# Patient Record
Sex: Male | Born: 1995
Health system: Southern US, Community
[De-identification: ages and names within clinical notes are randomized; demographics above are authoritative.]

## PROBLEM LIST (undated history)

## (undated) DIAGNOSIS — K219 Gastro-esophageal reflux disease without esophagitis: Secondary | ICD-10-CM

## (undated) DIAGNOSIS — S62109A Fracture of unspecified carpal bone, unspecified wrist, initial encounter for closed fracture: Secondary | ICD-10-CM

## (undated) DIAGNOSIS — T7840XA Allergy, unspecified, initial encounter: Secondary | ICD-10-CM

## (undated) DIAGNOSIS — K200 Eosinophilic esophagitis: Secondary | ICD-10-CM

## (undated) DIAGNOSIS — F909 Attention-deficit hyperactivity disorder, unspecified type: Secondary | ICD-10-CM

## (undated) DIAGNOSIS — F419 Anxiety disorder, unspecified: Secondary | ICD-10-CM

## (undated) HISTORY — PX: TYMPANOSTOMY TUBE PLACEMENT: SHX32

## (undated) HISTORY — DX: Fracture of unspecified carpal bone, unspecified wrist, initial encounter for closed fracture: S62.109A

## (undated) HISTORY — PX: WISDOM TOOTH EXTRACTION: SHX21

## (undated) HISTORY — PX: ESOPHAGOGASTRODUODENOSCOPY: SHX1529

## (undated) HISTORY — DX: Allergy, unspecified, initial encounter: T78.40XA

## (undated) HISTORY — DX: Eosinophilic esophagitis: K20.0

## (undated) HISTORY — DX: Attention-deficit hyperactivity disorder, unspecified type: F90.9

## (undated) HISTORY — DX: Gastro-esophageal reflux disease without esophagitis: K21.9

---
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
## 2016-05-11 ENCOUNTER — Encounter: Payer: Self-pay | Admitting: Family Medicine

## 2016-05-11 ENCOUNTER — Ambulatory Visit (INDEPENDENT_AMBULATORY_CARE_PROVIDER_SITE_OTHER): Payer: 59 | Admitting: Family Medicine

## 2016-05-11 VITALS — BP 134/82 | HR 89 | Temp 98.5°F | Ht 69.09 in | Wt 182.6 lb

## 2016-05-11 DIAGNOSIS — G4700 Insomnia, unspecified: Secondary | ICD-10-CM

## 2016-05-11 MED ORDER — CLONAZEPAM 0.5 MG PO TABS: 0.5000 mg | ORAL_TABLET | Freq: Two times a day (BID) | ORAL | 1 refills | Status: DC | PRN

## 2016-05-11 NOTE — Progress Notes (Signed)
Pre visit review using our clinic review tool, if applicable. No additional management support is needed unless otherwise documented below in the visit note. 

## 2016-05-11 NOTE — Progress Notes (Signed)
Mitchell [facility]man is a 21 y.o. male [facility]   History of Present Illness:   Chief Complaint  Patient presents with  . Establish Care  . Insomnia    x 1.5 years   Insomnia  Primary symptoms: difficulty falling asleep.  The problem occurs nightly. The problem is unchanged. The symptoms are aggravated by anxiety. Past treatments include nothing. Typical bedtime:  11-12 P.M..  How long after going to bed to you fall asleep: over an hour.   PMH includes: no depression, no restless leg syndrome, no chronic pain, no apnea. Prior diagnostic workup includes:  No prior workup.   Health Maintenance Due  Topic Date Due  . HIV Screening  06/08/2011  . TETANUS/TDAP  06/08/2015  . INFLUENZA VACCINE  09/23/2015   PMHx, SurgHx, SocialHx, Medications, and Allergies were reviewed in the Visit Navigator and updated as appropriate.   Past Medical History:  Diagnosis Date  . ADHD (attention deficit hyperactivity disorder)   . Allergy   . Asthma   . Wrist fracture    No past surgical history on file.  No family history on file.  Social History  Substance Use Topics  . Smoking status: Never Smoker  . Smokeless tobacco: Never Used  . Alcohol use No   Current Medications and Allergies:   None  No Known Allergies   Review of Systems:   Review of Systems  Constitutional: Negative for chills, fever and weight loss.  HENT: Negative for congestion, ear pain, hearing loss, nosebleeds and sore throat.   Eyes: Negative for blurred vision and pain.  Respiratory: Negative for apnea, cough, shortness of breath and wheezing.   Cardiovascular: Negative for chest pain.  Gastrointestinal: Negative for constipation, heartburn, nausea and vomiting.  Musculoskeletal: Negative for back pain, joint pain and neck pain.  Skin: Negative for itching and rash.  Neurological: Negative for dizziness, seizures, weakness and headaches.  Psychiatric/Behavioral: Negative for depression and  substance abuse. The patient has insomnia.     Vitals:   Vitals:   05/11/16 1534  BP: 134/82  Pulse: 89  Temp: 98.5 F (36.9 C)  TempSrc: Oral  SpO2: 97%  Weight: 182 lb 9.6 oz (82.8 kg)  Height: 5' 9.09" (1.755 m)     Body mass index is 26.89 kg/m.   Physical Exam:   Physical Exam  Constitutional: He is oriented to person, place, and time. He appears well-developed and well-nourished. No distress.  HENT:  Head: Normocephalic and atraumatic.  Right Ear: External ear normal.  Left Ear: External ear normal.  Nose: Nose normal.  Mouth/Throat: Oropharynx is clear and moist.  Eyes: Conjunctivae and EOM are normal. Pupils are equal, round, and reactive to light.  Neck: Normal range of motion. Neck supple.  Cardiovascular: Normal rate, regular rhythm, normal heart sounds and intact distal pulses.   Pulmonary/Chest: Effort normal and breath sounds normal.  Abdominal: Soft. Bowel sounds are normal.  Musculoskeletal: Normal range of motion.  Neurological: He is alert and oriented to person, place, and time.  Skin: Skin is warm and dry.  Psychiatric: He has a normal mood and affect. His behavior is normal. Judgment and thought content normal.  Nursing note and vitals reviewed.    Assessment and Plan:    [name] was seen today for establish care and insomnia.  Diagnoses and all orders for this visit:  Insomnia, unspecified type Comments: No red flags or concern for high risk behaviors. Patient would like to hold off on  a chronic medication for now. Okay short-term medication below with instructions for sleep hygiene. If not improving, patient to follow up for discussion re: chronic medication. Orders: -     clonazePAM (KLONOPIN) 0.5 MG tablet; Take 1 tablet (0.5 mg total) by mouth 2 (two) times daily as needed for anxiety.   . Reviewed expectations re: course of current medical issues. . Discussed self-management of symptoms. . Outlined signs and symptoms indicating need  for more acute intervention. . Patient verbalized understanding and all questions were answered. . See orders for this visit as documented in the electronic medical record. . Patient received an After Visit Summary.  Records requested if needed. I spent 20 minutes with this patient, greater than 50% was face-to-face time counseling regarding the above diagnoses.  CMA served as Neurosurgeon during this visit. History, Physical, and Plan performed by medical provider. Documentation and orders reviewed and attested to. Helane Rima, D.O.  Helane Rima, D.O. [place], [facility] 05/12/2016   Follow-up: No Follow-up on file.  Meds ordered this encounter  Medications  . clonazePAM (KLONOPIN) 0.5 MG tablet    Sig: Take 1 tablet (0.5 mg total) by mouth 2 (two) times daily as needed for anxiety.    Dispense:  20 tablet    Refill:  1   Medications Discontinued During This Encounter  Medication Reason  . fluticasone (FLONASE) 50 MCG/ACT nasal spray Patient Preference  . loratadine (CLARITIN) 10 MG tablet Patient Preference  . methylphenidate (CONCERTA) 54 MG CR tablet Patient Preference   No orders of the defined types were placed in this encounter.

## 2016-05-12 ENCOUNTER — Encounter: Payer: Self-pay | Admitting: Family Medicine

## 2016-06-04 ENCOUNTER — Other Ambulatory Visit: Payer: Self-pay | Admitting: Family Medicine

## 2016-06-04 DIAGNOSIS — J301 Allergic rhinitis due to pollen: Secondary | ICD-10-CM

## 2016-06-04 DIAGNOSIS — J302 Other seasonal allergic rhinitis: Secondary | ICD-10-CM | POA: Insufficient documentation

## 2016-06-04 MED ORDER — AZELASTINE-FLUTICASONE 137-50 MCG/ACT NA SUSP: NASAL | 3 refills | Status: DC

## 2016-06-04 MED ORDER — MONTELUKAST SODIUM 10 MG PO TABS: 10.0000 mg | ORAL_TABLET | Freq: Every day | ORAL | 3 refills | Status: DC

## 2016-07-07 ENCOUNTER — Other Ambulatory Visit: Payer: Self-pay | Admitting: Physician Assistant

## 2016-07-07 MED ORDER — TRIAMCINOLONE ACETONIDE 0.1 % EX CREA: 1.0000 "application " | TOPICAL_CREAM | Freq: Two times a day (BID) | CUTANEOUS | 0 refills | Status: DC

## 2016-09-10 ENCOUNTER — Other Ambulatory Visit: Payer: Self-pay | Admitting: Family Medicine

## 2016-09-10 MED ORDER — ALBUTEROL SULFATE HFA 108 (90 BASE) MCG/ACT IN AERS: 2.0000 | INHALATION_SPRAY | Freq: Four times a day (QID) | RESPIRATORY_TRACT | 2 refills | Status: DC | PRN

## 2016-09-13 ENCOUNTER — Encounter: Payer: Self-pay | Admitting: Family Medicine

## 2016-09-13 ENCOUNTER — Ambulatory Visit (INDEPENDENT_AMBULATORY_CARE_PROVIDER_SITE_OTHER): Payer: 59

## 2016-09-13 ENCOUNTER — Ambulatory Visit (INDEPENDENT_AMBULATORY_CARE_PROVIDER_SITE_OTHER): Payer: 59 | Admitting: Family Medicine

## 2016-09-13 VITALS — BP 128/94 | HR 102 | Temp 98.2°F

## 2016-09-13 DIAGNOSIS — R0789 Other chest pain: Secondary | ICD-10-CM | POA: Diagnosis not present

## 2016-09-13 DIAGNOSIS — G4700 Insomnia, unspecified: Secondary | ICD-10-CM | POA: Diagnosis not present

## 2016-09-13 DIAGNOSIS — R079 Chest pain, unspecified: Secondary | ICD-10-CM | POA: Diagnosis not present

## 2016-09-13 DIAGNOSIS — R0602 Shortness of breath: Secondary | ICD-10-CM | POA: Diagnosis not present

## 2016-09-13 MED ORDER — METHYLPREDNISOLONE ACETATE 80 MG/ML IJ SUSP
80.0000 mg | Freq: Once | INTRAMUSCULAR | Status: AC
  Administered 2016-09-13: 80 mg via INTRAMUSCULAR

## 2016-09-13 MED ORDER — CLONAZEPAM 0.5 MG PO TABS: 0.5000 mg | ORAL_TABLET | Freq: Two times a day (BID) | ORAL | 1 refills | Status: DC | PRN

## 2016-09-13 MED FILL — clonazePAM 0.5 MG TABS: 0.5 | 10 days supply | Qty: 20 | Fill #0

## 2016-09-13 NOTE — Progress Notes (Signed)
Mitchell Bowman is a 21 y.o. male here for an acute visit.  History of Present Illness:   Britt Bottom CMA acting as scribe for Dr. Earlene Plater.  HPI: Patient comes in today for chest pain that started about Tuesday of last week. Chest pain is generally on the left side that radiates to the right side. He states that he is having a hard time getting a deep breath.   PMHx, SurgHx, SocialHx, Medications, and Allergies were reviewed in the Visit Navigator and updated as appropriate.  Current Medications:   .  albuterol (PROVENTIL HFA;VENTOLIN HFA) 108 (90 Base) MCG/ACT inhaler, Inhale 2 puffs into the lungs every 6 (six) hours as needed for wheezing or shortness of breath., Disp: 1 Inhaler, Rfl: 2 .  Azelastine-Fluticasone (DYMISTA) 137-50 MCG/ACT SUSP, 2 sprays q nostril daily, Disp: 23 g, Rfl: 3 .  clonazePAM (KLONOPIN) 0.5 MG tablet, Take 1 tablet (0.5 mg total) by mouth 2 (two) times daily as needed for anxiety., Disp: 20 tablet, Rfl: 1 .  montelukast (SINGULAIR) 10 MG tablet, Take 1 tablet (10 mg total) by mouth at bedtime., Disp: 30 tablet, Rfl: 3 .  triamcinolone cream (KENALOG) 0.1 %, Apply 1 application topically 2 (two) times daily., Disp: 30 g, Rfl: 0   No Known Allergies   Review of Systems:   Pertinent items are noted in the HPI. Otherwise, ROS is negative.  Vitals:   Vitals:   09/13/16 1017 09/13/16 1029  BP: (!) 138/98 (!) 128/94  Pulse: (!) 102   Temp: 98.2 F (36.8 C)   TempSrc: Oral   SpO2: 97%      There is no height or weight on file to calculate BMI.  Physical Exam:   Physical Exam  Constitutional: He is oriented to person, place, and time. He appears well-developed and well-nourished. No distress.  HENT:  Head: Normocephalic and atraumatic.  Right Ear: External ear normal.  Left Ear: External ear normal.  Nose: Nose normal.  Mouth/Throat: Oropharynx is clear and moist.  Eyes: Pupils are equal, round, and reactive to light. Conjunctivae and EOM are  normal.  Neck: Normal range of motion. Neck supple.  Cardiovascular: Normal rate, regular rhythm, normal heart sounds and intact distal pulses.   Pulmonary/Chest: Effort normal and breath sounds normal.  Abdominal: Soft. Bowel sounds are normal.  Musculoskeletal: Normal range of motion.  Neurological: He is alert and oriented to person, place, and time.  Skin: Skin is warm and dry.  Psychiatric: He has a normal mood and affect. His behavior is normal. Judgment and thought content normal.  Nursing note and vitals reviewed.   EKG: normal EKG, normal sinus rhythm.   EXAM: CHEST  2 VIEW  COMPARISON:  No prior.  FINDINGS: Mediastinum hilar structures normal. Lungs are clear. No pleural effusion or pneumothorax. Heart size normal. Thoracic spine scoliosis .  IMPRESSION: No acute cardiopulmonary disease.  Assessment and Plan:   Diagnoses and all orders for this visit:  Atypical chest pain Comments: No red flags. HR normalized. No concern for PE. Likely pleuritic pain. Treatment as below. Symptomatic care and red flags reviewed. Orders: -     DG Chest 2 View -     EKG 12-Lead  Shortness of breath -     methylPREDNISolone acetate (DEPO-MEDROL) injection 80 mg; Inject 1 mL (80 mg total) into the muscle once.  Insomnia, unspecified type Comments: No red flags.  Orders: -     clonazePAM (KLONOPIN) 0.5 MG tablet; Take 1 tablet (0.5  mg total) by mouth 2 (two) times daily as needed for anxiety.    . Reviewed expectations re: course of current medical issues. . Discussed self-management of symptoms. . Outlined signs and symptoms indicating need for more acute intervention. . Patient verbalized understanding and all questions were answered. Marland Kitchen. Health Maintenance issues including appropriate healthy diet, exercise, and smoking avoidance were discussed with patient. . See orders for this visit as documented in the electronic medical record. . Patient received an After Visit  Summary.  CMA served as Neurosurgeonscribe during this visit. History, Physical, and Plan performed by medical provider. The above documentation has been reviewed and is accurate and complete. Helane RimaErica Sway Guttierrez, D.O.  Helane RimaErica Alanah Sakuma, DO [place] 09/17/2016

## 2016-10-13 ENCOUNTER — Other Ambulatory Visit: Payer: Self-pay | Admitting: Surgical

## 2016-10-13 ENCOUNTER — Ambulatory Visit (INDEPENDENT_AMBULATORY_CARE_PROVIDER_SITE_OTHER): Payer: 59 | Admitting: Family Medicine

## 2016-10-13 DIAGNOSIS — M7671 Peroneal tendinitis, right leg: Secondary | ICD-10-CM

## 2016-10-13 DIAGNOSIS — Z13220 Encounter for screening for lipoid disorders: Secondary | ICD-10-CM | POA: Diagnosis not present

## 2016-10-13 DIAGNOSIS — R5383 Other fatigue: Secondary | ICD-10-CM

## 2016-10-13 LAB — COMPREHENSIVE METABOLIC PANEL
ALT: 16 U/L (ref 0–53)
AST: 15 U/L (ref 0–37)
Albumin: 5.1 g/dL (ref 3.5–5.2)
Alkaline Phosphatase: 58 U/L (ref 39–117)
BUN: 14 mg/dL (ref 6–23)
CO2: 29 mEq/L (ref 19–32)
Calcium: 10 mg/dL (ref 8.4–10.5)
Chloride: 104 mEq/L (ref 96–112)
Creatinine, Ser: 0.8 mg/dL (ref 0.40–1.50)
GFR: 130.53 mL/min (ref >60.00)
Glucose, Bld: 92 mg/dL (ref 70–99)
Potassium: 3.8 mEq/L (ref 3.5–5.1)
Sodium: 140 mEq/L (ref 135–145)
Total Bilirubin: 0.6 mg/dL (ref 0.2–1.2)
Total Protein: 7.9 g/dL (ref 6.0–8.3)

## 2016-10-13 LAB — LIPID PANEL
Cholesterol: 172 mg/dL (ref 0–200)
HDL: 45.3 mg/dL (ref >39.00)
LDL Cholesterol: 96 mg/dL (ref 0–99)
NonHDL: 127.03
Total CHOL/HDL Ratio: 4
Triglycerides: 157 mg/dL — ABNORMAL HIGH (ref 0.0–149.0)
VLDL: 31.4 mg/dL (ref 0.0–40.0)

## 2016-10-13 LAB — CBC WITH DIFFERENTIAL/PLATELET
Basophils Absolute: 0.1 10*3/uL (ref 0.0–0.1)
Basophils Relative: 0.7 % (ref 0.0–3.0)
Eosinophils Absolute: 0.3 10*3/uL (ref 0.0–0.7)
Eosinophils Relative: 3.6 % (ref 0.0–5.0)
HCT: 46 % (ref 39.0–52.0)
Hemoglobin: 15.6 g/dL (ref 13.0–17.0)
Lymphocytes Relative: 38.3 % (ref 12.0–46.0)
Lymphs Abs: 2.7 10*3/uL (ref 0.7–4.0)
MCHC: 33.9 g/dL (ref 30.0–36.0)
MCV: 86.1 fl (ref 78.0–100.0)
Monocytes Absolute: 0.4 10*3/uL (ref 0.1–1.0)
Monocytes Relative: 5.7 % (ref 3.0–12.0)
Neutro Abs: 3.6 10*3/uL (ref 1.4–7.7)
Neutrophils Relative %: 51.7 % (ref 43.0–77.0)
Platelets: 209 10*3/uL (ref 150.0–400.0)
RBC: 5.35 Mil/uL (ref 4.22–5.81)
RDW: 12.2 % (ref 11.5–14.6)
WBC: 7 10*3/uL (ref 4.5–10.5)

## 2016-10-14 ENCOUNTER — Encounter: Payer: Self-pay | Admitting: Family Medicine

## 2016-10-14 DIAGNOSIS — M7671 Peroneal tendinitis, right leg: Secondary | ICD-10-CM | POA: Insufficient documentation

## 2016-10-14 NOTE — Progress Notes (Signed)
Mitchell Bowman is a 21 y.o. male here for an acute visit.  History of Present Illness:   Ankle Pain   The incident occurred more than 1 week ago. The incident occurred at school. There was no injury mechanism. The pain is present in the right ankle. The quality of the pain is described as aching. The pain is mild. The pain has been intermittent since onset. The symptoms are aggravated by weight bearing. He has tried nothing for the symptoms.   PMHx, SurgHx, SocialHx, Medications, and Allergies were reviewed in the Visit Navigator and updated as appropriate.  Current Medications:   Current Outpatient Prescriptions:  .  albuterol (PROVENTIL HFA;VENTOLIN HFA) 108 (90 Base) MCG/ACT inhaler, Inhale 2 puffs into the lungs every 6 (six) hours as needed for wheezing or shortness of breath., Disp: 1 Inhaler, Rfl: 2 .  clonazePAM (KLONOPIN) 0.5 MG tablet, Take 1 tablet (0.5 mg total) by mouth 2 (two) times daily as needed for anxiety., Disp: 20 tablet, Rfl: 1 .  montelukast (SINGULAIR) 10 MG tablet, Take 1 tablet (10 mg total) by mouth at bedtime., Disp: 30 tablet, Rfl: 3   No Known Allergies   Review of Systems:   Pertinent items are noted in the HPI. Otherwise, ROS is negative.  Vitals:  There were no vitals filed for this visit.   There is no height or weight on file to calculate BMI. Physical Exam:   Physical Exam  Musculoskeletal:       Right ankle: He exhibits normal range of motion, no deformity and normal pulse. Tenderness. Achilles tendon normal.       Feet:    Results for orders placed or performed in visit on 10/13/16  CBC with Differential/Platelet  Result Value Ref Range   WBC 7.0 4.5 - 10.5 K/uL   RBC 5.35 4.22 - 5.81 Mil/uL   Hemoglobin 15.6 13.0 - 17.0 g/dL   HCT 16.1 09.6 - 04.5 %   MCV 86.1 78.0 - 100.0 fl   MCHC 33.9 30.0 - 36.0 g/dL   RDW 40.9 81.1 - 91.4 %   Platelets 209.0 150.0 - 400.0 K/uL   Neutrophils Relative % 51.7 43.0 - 77.0 %   Lymphocytes  Relative 38.3 12.0 - 46.0 %   Monocytes Relative 5.7 3.0 - 12.0 %   Eosinophils Relative 3.6 0.0 - 5.0 %   Basophils Relative 0.7 0.0 - 3.0 %   Neutro Abs 3.6 1.4 - 7.7 K/uL   Lymphs Abs 2.7 0.7 - 4.0 K/uL   Monocytes Absolute 0.4 0.1 - 1.0 K/uL   Eosinophils Absolute 0.3 0.0 - 0.7 K/uL   Basophils Absolute 0.1 0.0 - 0.1 K/uL  Comprehensive metabolic panel  Result Value Ref Range   Sodium 140 135 - 145 mEq/L   Potassium 3.8 3.5 - 5.1 mEq/L   Chloride 104 96 - 112 mEq/L   CO2 29 19 - 32 mEq/L   Glucose, Bld 92 70 - 99 mg/dL   BUN 14 6 - 23 mg/dL   Creatinine, Ser 7.82 0.40 - 1.50 mg/dL   Total Bilirubin 0.6 0.2 - 1.2 mg/dL   Alkaline Phosphatase 58 39 - 117 U/L   AST 15 0 - 37 U/L   ALT 16 0 - 53 U/L   Total Protein 7.9 6.0 - 8.3 g/dL   Albumin 5.1 3.5 - 5.2 g/dL   Calcium 95.6 8.4 - 21.3 mg/dL   GFR 086.57 >84.69 mL/min  Lipid panel  Result Value Ref Range  Cholesterol 172 0 - 200 mg/dL   Triglycerides 748.2 (H) 0.0 - 149.0 mg/dL   HDL 70.78 >67.54 mg/dL   VLDL 49.2 0.0 - 01.0 mg/dL   LDL Cholesterol 96 0 - 99 mg/dL   Total CHOL/HDL Ratio 4    NonHDL 127.03     Assessment and Plan:   Diagnoses and all orders for this visit:  Screening for lipid disorders -     Lipid panel  Fatigue, unspecified type -     CBC with Differential/Platelet -     Comprehensive metabolic panel  Peroneal tendinitis of right lower extremity Comments: PRICE. To Berline Chough if not improving.    . Reviewed expectations re: course of current medical issues. . Discussed self-management of symptoms. . Outlined signs and symptoms indicating need for more acute intervention. . Patient verbalized understanding and all questions were answered. Marland Kitchen Health Maintenance issues including appropriate healthy diet, exercise, and smoking avoidance were discussed with patient. . See orders for this visit as documented in the electronic medical record. . Patient received an After Visit Summary.  Helane Rima, DO [place], [facility] 10/14/2016

## 2016-11-25 ENCOUNTER — Other Ambulatory Visit: Payer: Self-pay | Admitting: Family Medicine

## 2016-11-25 ENCOUNTER — Encounter: Payer: Self-pay | Admitting: Surgical

## 2016-11-25 DIAGNOSIS — R42 Dizziness and giddiness: Secondary | ICD-10-CM | POA: Insufficient documentation

## 2016-11-25 MED ORDER — MECLIZINE HCL 25 MG PO TABS: 25.0000 mg | ORAL_TABLET | Freq: Three times a day (TID) | ORAL | 0 refills | Status: DC | PRN

## 2016-11-25 MED ORDER — ONDANSETRON 4 MG PO TBDP: 4.0000 mg | ORAL_TABLET | Freq: Three times a day (TID) | ORAL | 0 refills | Status: DC | PRN

## 2016-11-25 MED FILL — ONDANSETRON ODT 4 MG TABLET: 4 | 6 days supply | Qty: 20 | Fill #0

## 2016-11-25 MED FILL — MECLIZINE 25 MG TABLET: 25 | 10 days supply | Qty: 30 | Fill #0

## 2017-03-24 ENCOUNTER — Other Ambulatory Visit: Payer: Self-pay | Admitting: Family Medicine

## 2017-03-24 DIAGNOSIS — G4700 Insomnia, unspecified: Secondary | ICD-10-CM

## 2017-03-24 NOTE — Telephone Encounter (Signed)
Please advise 

## 2017-03-25 MED FILL — clonazePAM 0.5 MG TABS: 0.5 | 10 days supply | Qty: 20 | Fill #0

## 2017-11-07 ENCOUNTER — Ambulatory Visit: Payer: 59 | Admitting: Physician Assistant

## 2017-11-07 ENCOUNTER — Encounter: Payer: Self-pay | Admitting: Physician Assistant

## 2017-11-07 VITALS — BP 126/80 | HR 103 | Temp 99.2°F | Ht 69.0 in | Wt 189.4 lb

## 2017-11-07 DIAGNOSIS — R05 Cough: Secondary | ICD-10-CM

## 2017-11-07 DIAGNOSIS — J069 Acute upper respiratory infection, unspecified: Secondary | ICD-10-CM | POA: Diagnosis not present

## 2017-11-07 DIAGNOSIS — R059 Cough, unspecified: Secondary | ICD-10-CM

## 2017-11-07 MED ORDER — AZITHROMYCIN 250 MG PO TABS: ORAL_TABLET | ORAL | 0 refills | Status: DC

## 2017-11-07 MED ORDER — METHYLPREDNISOLONE ACETATE 80 MG/ML IJ SUSP
80.0000 mg | Freq: Once | INTRAMUSCULAR | Status: AC
  Administered 2017-11-07: 80 mg via INTRAMUSCULAR

## 2017-11-07 NOTE — Progress Notes (Signed)
Mitchell Bowman is a 22 y.o. male here for a new problem.  I acted as a Neurosurgeonscribe for Mitchell East CorporationSamantha Lysa Livengood, PA-C Mitchell Mullonna Orphanos, LPN   History of Present Illness:   Chief Complaint  Patient presents with  . Sinus Problem    Sinus Problem  This is a new problem. Episode onset: Started 4 days ago on Friday evening. Maximum temperature: ? Saturday , did not take temp. Associated symptoms include chills, congestion (Chest ), coughing (non-productive), headaches, sinus pressure, sneezing and a sore throat. Pertinent negatives include no ear pain, neck pain or shortness of breath. Treatments tried: Mucinex, allergy pill. The treatment provided mild relief.   He has a friend that he was spending time with who had URI symptoms. He is eating and drinking well. Denies n/v/d/c. Does have hx of asthma and PNA. Does not use inhaler.  Past Medical History:  Diagnosis Date  . ADHD (attention deficit hyperactivity disorder)   . Allergy   . Asthma   . Wrist fracture      Social History   Socioeconomic History  . Marital status: Single    Spouse name: Not on file  . Number of children: Not on file  . Years of education: Not on file  . Highest education level: Not on file  Occupational History  . Occupation: Consulting civil engineertudent  Social Needs  . Financial resource strain: Not on file  . Food insecurity:    Worry: Not on file    Inability: Not on file  . Transportation needs:    Medical: Not on file    Non-medical: Not on file  Tobacco Use  . Smoking status: Never Smoker  . Smokeless tobacco: Never Used  Substance and Sexual Activity  . Alcohol use: No  . Drug use: No  . Sexual activity: Never  Lifestyle  . Physical activity:    Days per week: Not on file    Minutes per session: Not on file  . Stress: Not on file  Relationships  . Social connections:    Talks on phone: Not on file    Gets together: Not on file    Attends religious service: Not on file    Active member of club or organization: Not  on file    Attends meetings of clubs or organizations: Not on file    Relationship status: Not on file  . Intimate partner violence:    Fear of current or ex partner: Not on file    Emotionally abused: Not on file    Physically abused: Not on file    Forced sexual activity: Not on file  Other Topics Concern  . 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     History reviewed. No pertinent surgical history.  History reviewed. No pertinent family history.  No Known Allergies  Current Medications:   Current Outpatient Medications:  .  azithromycin (ZITHROMAX) 250 MG tablet, Take two tablets on day 1, then one tablet daily x 4 days, Disp: 6 tablet, Rfl: 0   Review of Systems:   Review of Systems  Constitutional: Positive for chills.  HENT: Positive for congestion (Chest ), sinus pressure, sneezing and sore throat. Negative for ear pain.   Respiratory: Positive for cough (non-productive). Negative for shortness of breath.   Musculoskeletal: Negative for neck pain.  Neurological: Positive for headaches.    Vitals:   Vitals:   11/07/17 1130  BP: 126/80  Pulse: (!) 103  Temp: 99.2  F (37.3 C)  TempSrc: Oral  SpO2: 95%  Weight: 189 lb 6.1 oz (85.9 kg)  Height: 5\' 9"  (1.753 m)     Body mass index is 27.97 kg/m.  Physical Exam:   Physical Exam  Constitutional: He appears well-developed. He is cooperative.  Non-toxic appearance. He does not have a sickly appearance. He does not appear ill. No distress.  HENT:  Head: Normocephalic and atraumatic.  Right Ear: Tympanic membrane, external ear and ear canal normal. Tympanic membrane is not erythematous, not retracted and not bulging.  Left Ear: Tympanic membrane, external ear and ear canal normal. Tympanic membrane is not erythematous, not retracted and not bulging.  Nose: Mucosal edema and rhinorrhea present. Right sinus exhibits maxillary sinus tenderness. Right sinus exhibits no frontal sinus  tenderness. Left sinus exhibits maxillary sinus tenderness. Left sinus exhibits no frontal sinus tenderness.  Mouth/Throat: Uvula is midline and mucous membranes are normal. Posterior oropharyngeal erythema present. No posterior oropharyngeal edema. Tonsils are 1+ on the right. Tonsils are 1+ on the left. No tonsillar exudate.  Eyes: Conjunctivae and lids are normal.  Neck: Trachea normal.  Cardiovascular: Normal rate, regular rhythm, S1 normal, S2 normal and normal heart sounds.  Pulmonary/Chest: Effort normal and breath sounds normal. He has no decreased breath sounds. He has no wheezes. He has no rhonchi. He has no rales.  Lymphadenopathy:    He has no cervical adenopathy.  Neurological: He is alert.  Skin: Skin is warm, dry and intact.  Psychiatric: He has a normal mood and affect. His speech is normal and behavior is normal.  Nursing note and vitals reviewed.   Assessment and Plan:    Mitchell Bowman was seen today for sinus problem.  Diagnoses and all orders for this visit:  Cough -     methylPREDNISolone acetate (DEPO-MEDROL) injection 80 mg  Other orders -     azithromycin (ZITHROMAX) 250 MG tablet; Take two tablets on day 1, then one tablet daily x 4 days   No red flags on exam.  Will initiate Azithromycin per orders. Received Depo-Medrol in office and tolerated well. Discussed taking medications as prescribed. Reviewed return precautions including worsening fever, SOB, worsening cough or other concerns. Push fluids and rest. I recommend that patient follow-up if symptoms worsen or persist despite treatment x 7-10 days, sooner if needed.  . Reviewed expectations re: course of current medical issues. . Discussed self-management of symptoms. . Outlined signs and symptoms indicating need for more acute intervention. . Patient verbalized understanding and all questions were answered. . See orders for this visit as documented in the electronic medical record. . Patient received an  After-Visit Summary.  Mitchell Motto, PA-C

## 2017-11-07 NOTE — Patient Instructions (Signed)
It was great to see you!  If you develop worsening symptoms, please let us know.  Use medication as prescribed: Azithromycin tablets  Push fluids and get plenty of rest. Please return if you are not improving as expected, or if you have high fevers (>101.5) or difficulty swallowing or worsening productive cough.  Call clinic with questions.  I hope you start feeling better soon!

## 2018-08-14 IMAGING — DX DG CHEST 2V
2 series · 2 of 2 positions shown · non-contrast
Comparison: No prior.

CLINICAL DATA: Chest pain.

EXAM:
CHEST  2 VIEW

[chest pa]
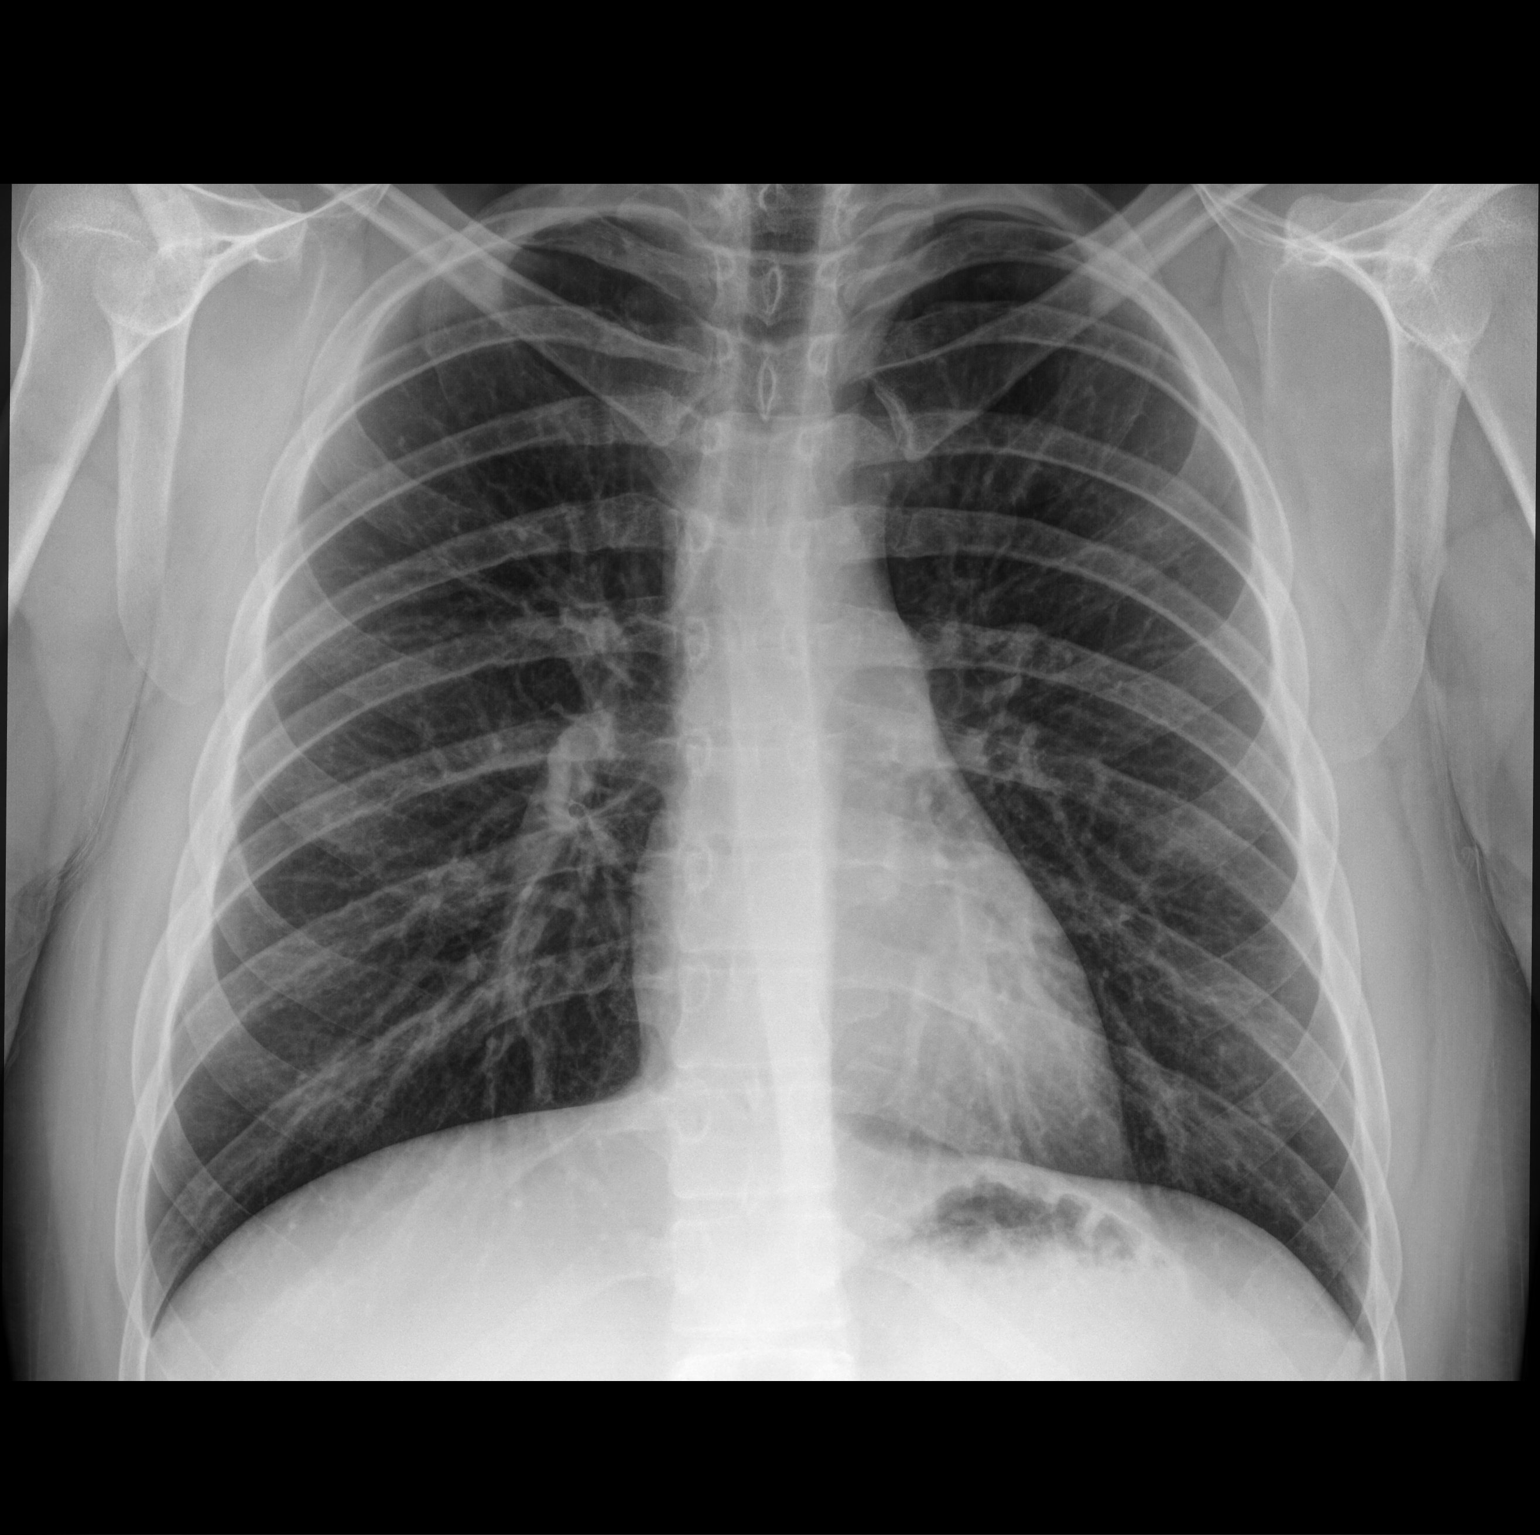

[chest lat]
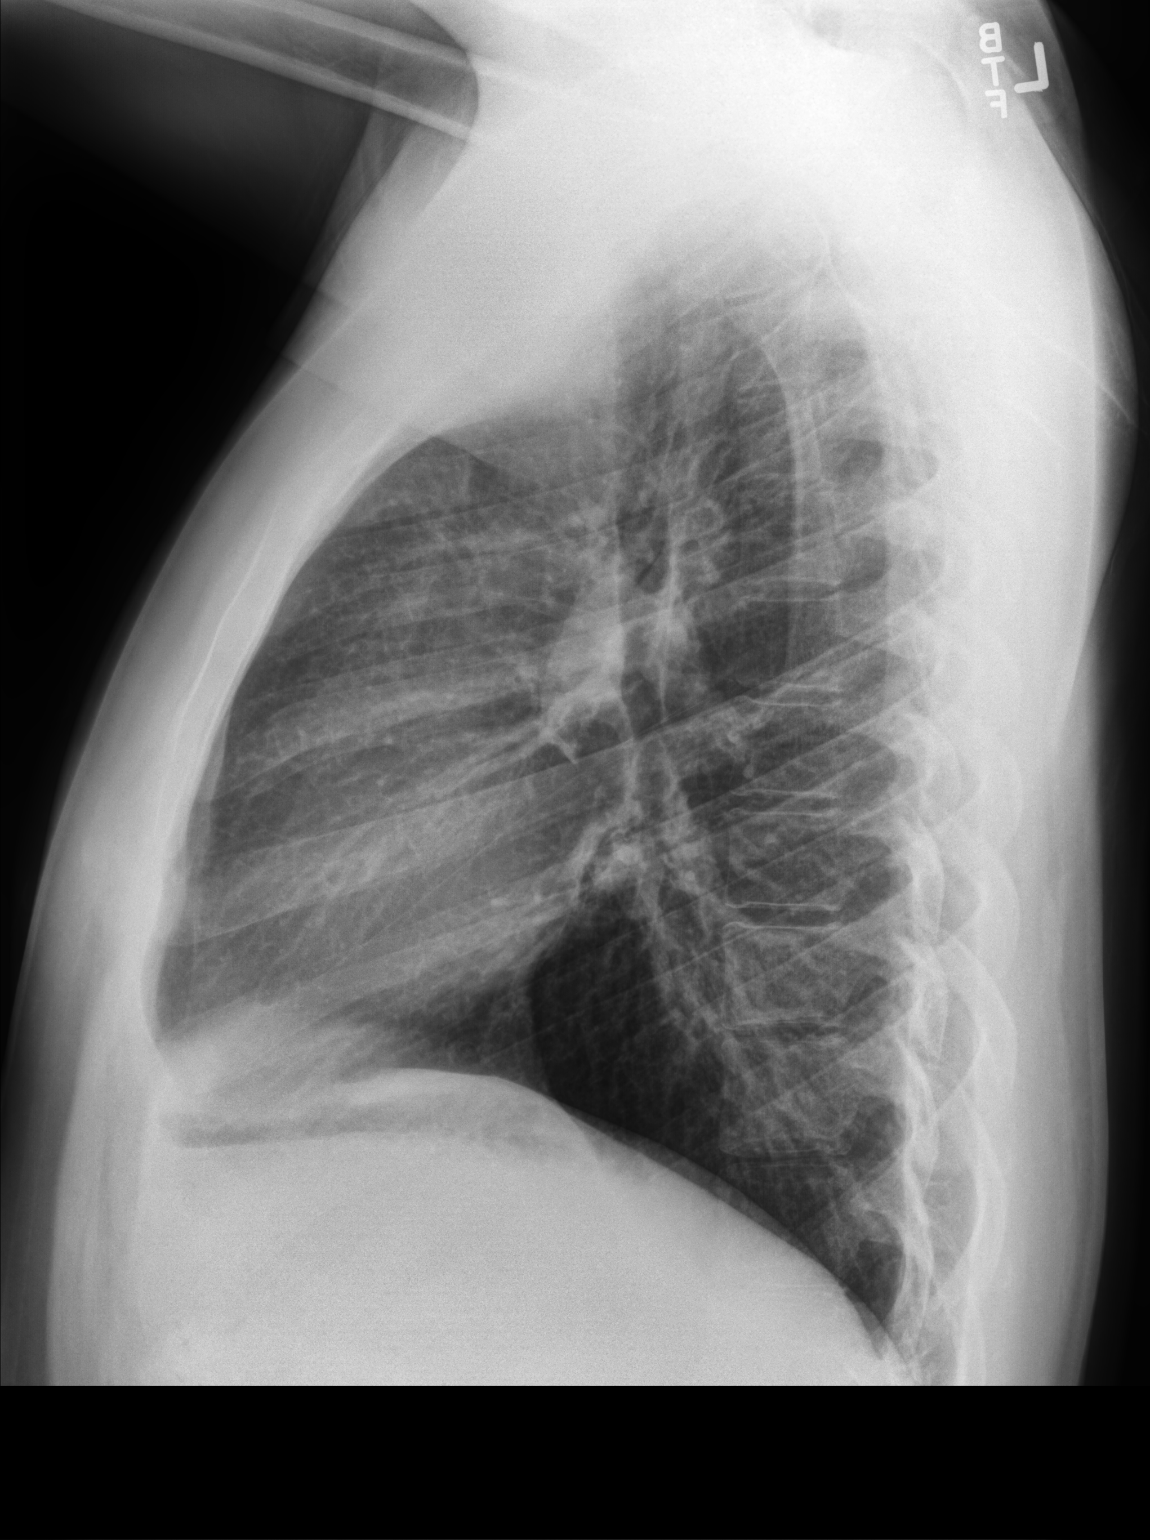

[2 of 2 positions shown; findings below may reference images not displayed]

FINDINGS: Mediastinum hilar structures normal. Lungs are clear. No pleural
effusion or pneumothorax. Heart size normal. Thoracic spine
scoliosis .
IMPRESSION: No acute cardiopulmonary disease.

## 2018-10-04 ENCOUNTER — Other Ambulatory Visit: Payer: Self-pay

## 2018-10-04 ENCOUNTER — Ambulatory Visit (INDEPENDENT_AMBULATORY_CARE_PROVIDER_SITE_OTHER): Payer: 59 | Admitting: Family Medicine

## 2018-10-04 ENCOUNTER — Encounter: Payer: Self-pay | Admitting: Family Medicine

## 2018-10-04 VITALS — Ht 69.0 in | Wt 189.0 lb

## 2018-10-04 DIAGNOSIS — F4323 Adjustment disorder with mixed anxiety and depressed mood: Secondary | ICD-10-CM | POA: Diagnosis not present

## 2018-10-04 DIAGNOSIS — F5101 Primary insomnia: Secondary | ICD-10-CM

## 2018-10-04 DIAGNOSIS — F900 Attention-deficit hyperactivity disorder, predominantly inattentive type: Secondary | ICD-10-CM

## 2018-10-04 MED ORDER — CLONAZEPAM 0.5 MG PO TABS: 0.5000 mg | ORAL_TABLET | Freq: Every day | ORAL | 2 refills | Status: DC

## 2018-10-04 MED FILL — clonazePAM 0.5 MG TABS: 0.5 | 30 days supply | Qty: 30 | Fill #0

## 2018-10-04 NOTE — Progress Notes (Signed)
Virtual Visit via Video   Due to the COVID-19 pandemic, this visit was completed with telemedicine (audio/video) technology to reduce patient and provider exposure as well as to preserve personal protective equipment.   I connected with Mitchell Bowman by a video enabled telemedicine application and verified that I am speaking with the correct person using two identifiers. Location patient: Home Location provider: [facility], Office Persons participating in the virtual visit: Mitchell Bowman, Briscoe Deutscher, DO Lonell Grandchild, CMA acting as scribe for Dr. Briscoe Deutscher.   I discussed the limitations of evaluation and management by telemedicine and the availability of in person appointments. The patient expressed understanding and agreed to proceed.  Care Team   Patient Care Team: Briscoe Deutscher, DO as PCP - General (Family Medicine) Dennard Schaumann Cammie Mcgee, MD (Family Medicine)  Subjective:   HPI:   Patient is having increased anxiety. Symptoms have increased with start of summer.   Current symptoms: fatigue, insomnia, irritable, racing thoughts. No current suicidal and homicidal ideation. Side effects from treatment: none. He has never tried any medications or treatments in the past.   Review of Systems  Constitutional: Negative for chills and fever.  HENT: Negative for hearing loss and tinnitus.   Eyes: Negative for blurred vision and double vision.  Respiratory: Negative for cough and wheezing.   Cardiovascular: Negative for chest pain, palpitations and leg swelling.  Gastrointestinal: Negative for nausea and vomiting.  Genitourinary: Negative for dysuria and urgency.  Musculoskeletal: Negative for myalgias.  Neurological: Negative for dizziness and headaches.  Psychiatric/Behavioral: Negative for depression and suicidal ideas.     Patient Active Problem List   Diagnosis Date Noted  . Situational mixed anxiety and depressive disorder 10/14/2018  . Primary insomnia 10/14/2018   . Vertigo 11/25/2016  . Peroneal tendinitis of right lower extremity 10/14/2016  . Seasonal allergies 06/04/2016  . Asthma   . ADHD (attention deficit hyperactivity disorder)     Social History   Tobacco Use  . Smoking status: Never Smoker  . Smokeless tobacco: Never Used  Substance Use Topics  . Alcohol use: No   Current Outpatient Medications:  . None  No Known Allergies  Objective:   VITALS: Per patient if applicable, see vitals. GENERAL: Alert, appears well and in no acute distress. HEENT: Atraumatic, conjunctiva clear, no obvious abnormalities on inspection of external nose and ears. NECK: Normal movements of the head and neck. CARDIOPULMONARY: No increased WOB. Speaking in clear sentences. I:E ratio WNL.  MS: Moves all visible extremities without noticeable abnormality. PSYCH: Pleasant and cooperative, well-groomed. Speech normal rate and rhythm. Affect is appropriate. Insight and judgement are appropriate. Attention is focused, linear, and appropriate.  NEURO: CN grossly intact. Oriented as arrived to appointment on time with no prompting. Moves both UE equally.  SKIN: No obvious lesions, wounds, erythema, or cyanosis noted on face or hands.  Depression screen [facility] 2/9 10/04/2018 05/11/2016  Decreased Interest 0 0  Down, Depressed, Hopeless 1 0  PHQ - 2 Score 1 0  Altered sleeping 2 -  Tired, decreased energy 1 -  Change in appetite 0 -  Feeling bad or failure about yourself  1 -  Trouble concentrating 0 -  Moving slowly or fidgety/restless 0 -  Suicidal thoughts 0 -  PHQ-9 Score 5 -  Difficult doing work/chores Not difficult at all -   Assessment and Plan:   [name] was seen today for anxiety.  Diagnoses and all orders for this visit:  Situational mixed anxiety  and depressive disorder Comments: Reviewed medication and therapy options.   Attention deficit hyperactivity disorder (ADHD), predominantly inattentive type Comments: Discussed treatment of ADHD  to help with online classes.  Primary insomnia Comments: Will focus on sleep hygeine, regulation first.  Orders: -     clonazePAM (KLONOPIN) 0.5 MG tablet; Take 1 tablet (0.5 mg total) by mouth at bedtime.   Marland Kitchen. COVID-19 Education: The signs and symptoms of COVID-19 were discussed with the patient and how to seek care for testing if needed. The importance of social distancing was discussed today. . Reviewed expectations re: course of current medical issues. . Discussed self-management of symptoms. . Outlined signs and symptoms indicating need for more acute intervention. . Patient verbalized understanding and all questions were answered. Marland Kitchen. Health Maintenance issues including appropriate healthy diet, exercise, and smoking avoidance were discussed with patient. . See orders for this visit as documented in the electronic medical record.  Helane RimaErica Nikoloz Huy, DO  Records requested if needed. Time spent: 25 minutes, of which >50% was spent in obtaining information about his symptoms, reviewing his previous labs, evaluations, and treatments, counseling him about his condition (please see the discussed topics above), and developing a plan to further investigate it; he had a number of questions which I addressed.

## 2018-10-14 DIAGNOSIS — F4323 Adjustment disorder with mixed anxiety and depressed mood: Secondary | ICD-10-CM | POA: Insufficient documentation

## 2018-10-14 DIAGNOSIS — F5101 Primary insomnia: Secondary | ICD-10-CM | POA: Insufficient documentation

## 2018-11-30 MED FILL — clonazePAM 0.5 MG TABS: 0.5 | 30 days supply | Qty: 30 | Fill #1

## 2019-06-01 ENCOUNTER — Ambulatory Visit: Payer: 59

## 2019-06-02 ENCOUNTER — Ambulatory Visit: Payer: Self-pay | Attending: Internal Medicine

## 2019-06-02 DIAGNOSIS — Z23 Encounter for immunization: Secondary | ICD-10-CM

## 2019-06-02 NOTE — Progress Notes (Signed)
   Covid-19 Vaccination Clinic  Name:  CORBETT MOULDER    MRN: [id number] DOB: [birth year]  06/02/2019  Mr. Holmer was observed post Covid-19 immunization for 15 minutes without incident. He was provided with Vaccine Information Sheet and instruction to access the V-Safe system.   Mr. Ericson was instructed to call 911 with any severe reactions post vaccine: Marland Kitchen Difficulty breathing  . Swelling of face and throat  . A fast heartbeat  . A bad rash all over body  . Dizziness and weakness   Immunizations Administered    Name Date Dose VIS Date Route   Pfizer COVID-19 Vaccine 06/02/2019  3:35 PM 0.3 mL 02/02/2019 Intramuscular   Manufacturer: [employer], [employer]   Lot: [id number]   NDC: [address]-5

## 2019-06-23 ENCOUNTER — Ambulatory Visit: Payer: Self-pay | Attending: Internal Medicine

## 2019-06-23 DIAGNOSIS — Z23 Encounter for immunization: Secondary | ICD-10-CM

## 2019-06-23 NOTE — Progress Notes (Signed)
   Covid-19 Vaccination Clinic  Name:  Mitchell Bowman    MRN: [id number] DOB: 1995/10/28  06/23/2019  [name] was observed post Covid-19 immunization for 15 minutes without incident. He was provided with Vaccine Information Sheet and instruction to access the V-Safe system.   [name] was instructed to call 911 with any severe reactions post vaccine: Marland Kitchen Difficulty breathing  . Swelling of face and throat  . A fast heartbeat  . A bad rash all over body  . Dizziness and weakness   Immunizations Administered    Name Date Dose VIS Date Route   Pfizer COVID-19 Vaccine 06/23/2019 11:22 AM 0.3 mL 04/18/2018 Intramuscular   Manufacturer: [employer], [employer]   Lot: [id number]   NDC: [address]-7

## 2019-06-25 ENCOUNTER — Ambulatory Visit: Payer: Self-pay

## 2019-10-09 ENCOUNTER — Other Ambulatory Visit: Payer: Self-pay | Admitting: Family Medicine

## 2019-10-09 ENCOUNTER — Telehealth (INDEPENDENT_AMBULATORY_CARE_PROVIDER_SITE_OTHER): Payer: Self-pay | Admitting: Family Medicine

## 2019-10-09 DIAGNOSIS — F900 Attention-deficit hyperactivity disorder, predominantly inattentive type: Secondary | ICD-10-CM

## 2019-10-09 DIAGNOSIS — F4323 Adjustment disorder with mixed anxiety and depressed mood: Secondary | ICD-10-CM

## 2019-10-09 MED ORDER — CITALOPRAM HYDROBROMIDE 20 MG PO TABS: 20.0000 mg | ORAL_TABLET | Freq: Every day | ORAL | 3 refills | Status: DC

## 2019-10-09 NOTE — Assessment & Plan Note (Signed)
Symptoms not currently well controlled.  Discussed various options.  We will start Celexa 20 mg daily.  Discussed potential side effects.  He will check with me in a couple weeks via MyChart.

## 2019-10-09 NOTE — Progress Notes (Signed)
   DAITON COWLES is a 24 y.o. male who presents today for a virtual office visit.  Assessment/Plan:  Chronic Problems Addressed Today: Situational mixed anxiety and depressive disorder Symptoms not currently well controlled.  Discussed various options.  We will start Celexa 20 mg daily.  Discussed potential side effects.  He will check with me in a couple weeks via MyChart.  ADHD (attention deficit hyperactivity disorder) Stable without meds.     Subjective:  HPI:  See A/p.          Objective/Observations  Physical Exam: Gen: NAD, resting comfortably Pulm: Normal work of breathing Neuro: Grossly normal, moves all extremities Psych: Normal affect and thought content  Virtual Visit via Video   I connected with Laqueta Linden Lozon on 10/09/19 at  4:00 PM EDT by a video enabled telemedicine application and verified that I am speaking with the correct person using two identifiers. The limitations of evaluation and management by telemedicine and the availability of in person appointments were discussed. The patient expressed understanding and agreed to proceed.   Patient location: Home Provider location: [place] [facility] [employer] Persons participating in the virtual visit: Myself and Patient     [name]. Jimmey Ralph, MD 10/09/2019 9:23 AM

## 2019-10-09 NOTE — Assessment & Plan Note (Signed)
Stable without meds. 

## 2020-03-04 ENCOUNTER — Other Ambulatory Visit: Payer: Self-pay | Admitting: Family Medicine

## 2020-03-04 DIAGNOSIS — F5101 Primary insomnia: Secondary | ICD-10-CM

## 2020-03-04 MED FILL — CITALOPRAM HBR 20 MG TABLET: 20 | 90 days supply | Qty: 90 | Fill #0

## 2020-05-12 ENCOUNTER — Other Ambulatory Visit (HOSPITAL_BASED_OUTPATIENT_CLINIC_OR_DEPARTMENT_OTHER): Payer: Self-pay

## 2020-06-06 ENCOUNTER — Other Ambulatory Visit (HOSPITAL_COMMUNITY): Payer: Self-pay

## 2020-06-06 MED FILL — Citalopram Hydrobromide Tab 20 MG (Base Equiv): ORAL | 90 days supply | Qty: 90 | Fill #0 | Status: CN

## 2020-06-10 ENCOUNTER — Other Ambulatory Visit (HOSPITAL_COMMUNITY): Payer: Self-pay

## 2020-06-13 ENCOUNTER — Other Ambulatory Visit (HOSPITAL_COMMUNITY): Payer: Self-pay

## 2020-06-17 ENCOUNTER — Other Ambulatory Visit (HOSPITAL_COMMUNITY): Payer: Self-pay

## 2020-07-23 ENCOUNTER — Other Ambulatory Visit (HOSPITAL_COMMUNITY): Payer: Self-pay

## 2020-07-23 MED FILL — Citalopram Hydrobromide Tab 20 MG (Base Equiv): ORAL | 90 days supply | Qty: 90 | Fill #0 | Status: AC

## 2020-10-07 ENCOUNTER — Encounter: Payer: Self-pay | Admitting: Family Medicine

## 2020-10-07 ENCOUNTER — Telehealth (INDEPENDENT_AMBULATORY_CARE_PROVIDER_SITE_OTHER): Payer: No Typology Code available for payment source | Admitting: Family Medicine

## 2020-10-07 DIAGNOSIS — R059 Cough, unspecified: Secondary | ICD-10-CM

## 2020-10-07 DIAGNOSIS — Z20822 Contact with and (suspected) exposure to covid-19: Secondary | ICD-10-CM | POA: Diagnosis not present

## 2020-10-07 DIAGNOSIS — R509 Fever, unspecified: Secondary | ICD-10-CM

## 2020-10-07 MED ORDER — BENZONATATE 100 MG PO CAPS: 100.0000 mg | ORAL_CAPSULE | Freq: Two times a day (BID) | ORAL | 0 refills | Status: DC | PRN

## 2020-10-07 NOTE — Progress Notes (Signed)
Virtual Visit via Video Note  I connected with Mitchell Bowman  on 10/07/20 at 11:00 AM EDT by a video enabled telemedicine application and verified that I am speaking with the correct person using two identifiers.  Location patient: home, [place] Location provider:work or home office Persons participating in the virtual visit: patient, provider  I discussed the limitations of evaluation and management by telemedicine and the availability of in person appointments. The patient expressed understanding and agreed to proceed.   HPI:  Acute telemedicine visit for likely covid19: -Onset: yesterday -Symptoms include: sore throat, fever, chills, nasal congestion, mild cough, nausea -multiple household contacts positive with covid -Denies:NVD, CP, SOB, inability to eat/drink/get out of bed -Has tried: dayquil -Pertinent past medical history: asthma - mild as a child, denies any other chronic medical issues other than ADHD and allergies -reports has not required any medications for the asthma since he was a child -Pertinent medication allergies: No Known Allergies -COVID-19 vaccine status: 2 doses of pfizer  ROS: See pertinent positives and negatives per HPI.  Past Medical History:  Diagnosis Date   ADHD (attention deficit hyperactivity disorder)    Allergy    Asthma    Wrist fracture     History reviewed. No pertinent surgical history.   Current Outpatient Medications:    benzonatate (TESSALON) 100 MG capsule, Take 1 capsule (100 mg total) by mouth 2 (two) times daily as needed for cough., Disp: 20 capsule, Rfl: 0   citalopram (CELEXA) 20 MG tablet, TAKE 1 TABLET BY MOUTH ONCE A DAY, Disp: 330 tablet, Rfl: 0   hydrOXYzine (ATARAX/VISTARIL) 25 MG tablet, Take 25 mg by mouth daily., Disp: , Rfl:   EXAM:  VITALS per patient if applicable:  GENERAL: alert, oriented, appears well and in no acute distress  HEENT: atraumatic, conjunttiva clear, no obvious abnormalities on inspection of external  nose and ears  NECK: normal movements of the head and neck  LUNGS: on inspection no signs of respiratory distress, breathing rate appears normal, no obvious gross SOB, gasping or wheezing  CV: no obvious cyanosis  MS: moves all visible extremities without noticeable abnormality  PSYCH/NEURO: pleasant and cooperative, no obvious depression or anxiety, speech and thought processing grossly intact  ASSESSMENT AND PLAN:  Discussed the following assessment and plan:  No diagnosis found.  -we discussed possible serious and likely etiologies, options for evaluation and workup, limitations of telemedicine visit vs in person visit, treatment, treatment risks and precautions. Pt prefers to treat via telemedicine empirically rather than in person at this moment. Suspect covid is most likely vs other. He has opted to retest - advised PCR or testing on day 3-4 may be more likely to pick it up. Discussed transmission, isolation, precautions, potential complications, treatment options if positive. He has opted for Tessalon for cough and symptomatic care per patient instructions.  Work/School slipped offered: declined Scheduled follow up with PCP offered: has opted for follow up as needed with PCP or [facility] or other Advised to seek prompt in person care if worsening, new symptoms arise, or if is not improving with treatment. Discussed options for inperson care if PCP office not available. Did let this patient know that I only do telemedicine on Tuesdays and Thursdays for [place]. Advised to schedule follow up visit with PCP or [facility] if any further questions or concerns to avoid delays in care.   I discussed the assessment and treatment plan with the patient. The patient was provided an opportunity to ask questions and all were  answered. The patient agreed with the plan and demonstrated an understanding of the instructions.     Lucretia Kern, DO

## 2020-10-07 NOTE — Patient Instructions (Addendum)
  HOME CARE TIPS:  -[place] COVID19 testing information: [email or web address] [place] [phone] Most pharmacies also offer testing and home test kits. If the Covid19 test is positive, please make a prompt follow up visit with your primary care office or with [place] to discuss treatment options. Treatments for Covid19 are best given early in the course of the illness.   -I sent the medication(s) we discussed to your pharmacy: Meds ordered this encounter  Medications   benzonatate (TESSALON) 100 MG capsule    Sig: Take 1 capsule (100 mg total) by mouth 2 (two) times daily as needed for cough.    Dispense:  20 capsule    Refill:  0     -can use tylenol or aleve if needed for fevers, aches and pains per instructions  -can use nasal saline a few times per day if you have nasal congestion; sometimes  a short course of Afrin nasal spray for 3 days can help with symptoms as well  -stay hydrated, drink plenty of fluids and eat small healthy meals - avoid dairy  -can take 1000 IU ( ) Vit D3 and 100-500 mg of Vit C daily per instructions  -If the Covid test is positive, check out the [facility] website for more information on home care, transmission and treatment for COVID19  -follow up with your doctor in 2-3 days unless improving and feeling better  -stay home while sick, except to seek medical care. If you have COVID19, ideally it would be best to stay home for a full 10 days since the onset of symptoms PLUS one day of no fever and feeling better. Wear a good mask that fits snugly (such as N95 or KN95) if around others to reduce the risk of transmission.  It was nice to meet you today, and I really hope you are feeling better soon. I help [place] out with telemedicine visits on Tuesdays and Thursdays and am available for visits on those days. If you have any concerns or questions following this visit please schedule a follow up visit with your Primary  Care doctor or seek care at a local urgent care clinic to avoid delays in care.    Seek in person care or schedule a follow up video visit promptly if your symptoms worsen, new concerns arise or you are not improving with treatment. Call 911 and/or seek emergency care if your symptoms are severe or life threatening.

## 2020-10-08 ENCOUNTER — Encounter: Payer: Self-pay | Admitting: Family Medicine

## 2020-10-10 MED ORDER — MOLNUPIRAVIR 200 MG PO CAPS: 800.0000 mg | ORAL_CAPSULE | Freq: Two times a day (BID) | ORAL | 0 refills | Status: AC

## 2020-10-10 NOTE — Telephone Encounter (Signed)
Please advise 

## 2020-11-04 ENCOUNTER — Other Ambulatory Visit: Payer: Self-pay

## 2020-11-04 ENCOUNTER — Other Ambulatory Visit (HOSPITAL_BASED_OUTPATIENT_CLINIC_OR_DEPARTMENT_OTHER): Payer: Self-pay

## 2020-11-04 ENCOUNTER — Encounter: Payer: Self-pay | Admitting: Family Medicine

## 2020-11-04 ENCOUNTER — Ambulatory Visit (INDEPENDENT_AMBULATORY_CARE_PROVIDER_SITE_OTHER): Payer: No Typology Code available for payment source | Admitting: Family Medicine

## 2020-11-04 VITALS — BP 133/84 | HR 82 | Temp 98.3°F | Ht 69.0 in | Wt 204.2 lb

## 2020-11-04 DIAGNOSIS — F900 Attention-deficit hyperactivity disorder, predominantly inattentive type: Secondary | ICD-10-CM | POA: Diagnosis not present

## 2020-11-04 DIAGNOSIS — F4323 Adjustment disorder with mixed anxiety and depressed mood: Secondary | ICD-10-CM

## 2020-11-04 MED ORDER — METHYLPHENIDATE HCL ER (OSM) 27 MG PO TBCR
27.0000 mg | EXTENDED_RELEASE_TABLET | ORAL | 0 refills | Status: DC
  Filled 2020-11-04: qty 30, 30d supply, fill #0

## 2020-11-04 MED ORDER — DICLOFENAC SODIUM 75 MG PO TBEC: 75.0000 mg | DELAYED_RELEASE_TABLET | Freq: Two times a day (BID) | ORAL | 0 refills | Status: DC

## 2020-11-04 MED ORDER — METHYLPHENIDATE HCL ER (OSM) 27 MG PO TBCR: 27.0000 mg | EXTENDED_RELEASE_TABLET | ORAL | 0 refills | Status: DC

## 2020-11-04 MED ORDER — DICLOFENAC SODIUM 75 MG PO TBEC
75.0000 mg | DELAYED_RELEASE_TABLET | Freq: Two times a day (BID) | ORAL | 0 refills | Status: DC
  Filled 2020-11-04: qty 30, 15d supply, fill #0

## 2020-11-04 NOTE — Assessment & Plan Note (Signed)
Symptoms have been worsening.  He has been on Concerta in the past and has done well with this.  Able to see previous prescriptions and diagnoses.  We will start 27 mg daily.  Discussed potential side effects.  He will check in with me in a couple of weeks via MyChart.  We will titrate dose as needed.  He was on 54 mg as an adolescent and tolerated well.  He will need controlled substance agreement once we find a stable dose/medication.

## 2020-11-04 NOTE — Progress Notes (Signed)
   Mitchell Bowman is a 25 y.o. male who presents today for an office visit.  Assessment/Plan:  New/Acute Problems: Left wrist pain Consistent with mild tenosynovitis.  We will start diclofenac.  He will let me know if not improving.  Chronic Problems Addressed Today: Situational mixed anxiety and depressive disorder Lengthy discussion with patient regarding his recent strep throat regarding career path that he would like to take.  He does have a history of anxiety and depression he is not sure if the Celexa is helping.  He additionally has a history of ADHD but has not been treated for several years.  At this point he feels like the ADHD has more problematic than anxiety and depression.  We will restart Concerta as below.  He will check in with me in a couple of weeks via MyChart.  We can switch from Celexa to alternative SSRI depending on his response to concerta.  Will place referral to see therapist as well.  ADHD (attention deficit hyperactivity disorder) Symptoms have been worsening.  He has been on Concerta in the past and has done well with this.  Able to see previous prescriptions and diagnoses.  We will start 27 mg daily.  Discussed potential side effects.  He will check in with me in a couple of weeks via MyChart.  We will titrate dose as needed.  He was on 54 mg as an adolescent and tolerated well.  He will need controlled substance agreement once we find a stable dose/medication.     Subjective:  HPI:   See A/p for status of chronic conditions.   He is having increased anxiety due to school. He is not sure what career path he wants to take. 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 He no longer feels like he wants to do compci.   He went undecided major during his junior year. He feels like he is not interesting in any major. He is currently struggling with this situation He notes Celexa did not help with symptoms.  He has had a history of ADHD. He  would like to see therapist. He does not feel like his school advisors have been helpful.  He also complain of left wrist pain that started yesterday. No injuries or precipitating events.  No treatments tried.  He sleeps on his back and is concerned it might be due to this.       Objective:  Physical Exam: BP 133/84   Pulse 82   Temp 98.3 F (36.8 C) (Temporal)   Ht 5\' 9"  (1.753 m)   Wt 204 lb 3.2 oz (92.6 kg)   SpO2 97%   BMI 30.16 kg/m   Gen: No acute distress, resting comfortably CV: Regular rate and rhythm with no murmurs appreciated Pulm: Normal work of breathing, clear to auscultation bilaterally with no crackles, wheezes, or rhonchi MSK: Left wrist without abnormality.  Finkelstein test mildly positive.  No snuffbox tenderness Neuro: Grossly normal, moves all extremities Psych: Normal affect and thought content       I,Savera Zaman,acting as a scribe for , MD.,have documented all relevant documentation on the behalf of [name], MD,as directed by  [name], MD while in the presence of [name], MD.   I, [name], MD, have reviewed all documentation for this visit. The documentation on 11/04/20 for the exam, diagnosis, procedures, and orders are all accurate and complete.  11/06/20. [name], MD 11/04/2020 2:48 PM

## 2020-11-04 NOTE — Addendum Note (Signed)
Addended by: Ardith Dark on: 11/04/2020 03:27 PM   Modules accepted: Orders

## 2020-11-04 NOTE — Patient Instructions (Signed)
It was very nice to see you today!  Please start the diclofenac for your wrist.  Let me know if no improvement.  We will start the Concerta.  We will continue Celexa for now.  Please send me a message in a few weeks to let me know how this is working for you.  We can switch you from Celexa to another medication at some point in the next few weeks.  I will place a referral for you to see a therapist as well.   Take care, Dr Jimmey Ralph  PLEASE NOTE:  If you had any lab tests please let us know if you have not heard back within a few days. You may see your results on mychart before we have a chance to review them but we will give you a call once they are reviewed by [place]. If we ordered any referrals today, please let us know if you have not heard from their office within the next week.   Please try these tips to maintain a healthy lifestyle:  Eat at least 3 REAL meals and 1-2 snacks per day.  Aim for no more than 5 hours between eating.  If you eat breakfast, please do so within one hour of getting up.   Each meal should contain half fruits/vegetables, one quarter protein, and one quarter carbs (no bigger than a computer mouse)  Cut down on sweet beverages. This includes juice, soda, and sweet tea.   Drink at least 1 glass of water with each meal and aim for at least 8 glasses per day  Exercise at least 150 minutes every week.

## 2020-11-04 NOTE — Assessment & Plan Note (Signed)
Lengthy discussion with patient regarding his recent strep throat regarding career path that he would like to take.  He does have a history of anxiety and depression he is not sure if the Celexa is helping.  He additionally has a history of ADHD but has not been treated for several years.  At this point he feels like the ADHD has more problematic than anxiety and depression.  We will restart Concerta as below.  He will check in with me in a couple of weeks via MyChart.  We can switch from Celexa to alternative SSRI depending on his response to concerta.  Will place referral to see therapist as well.

## 2020-11-11 ENCOUNTER — Encounter: Payer: Self-pay | Admitting: Family Medicine

## 2020-11-13 NOTE — Telephone Encounter (Signed)
Please advise 

## 2020-11-14 ENCOUNTER — Other Ambulatory Visit: Payer: Self-pay | Admitting: *Deleted

## 2020-11-14 ENCOUNTER — Other Ambulatory Visit (HOSPITAL_BASED_OUTPATIENT_CLINIC_OR_DEPARTMENT_OTHER): Payer: Self-pay

## 2020-11-14 MED ORDER — NORTRIPTYLINE HCL 25 MG PO CAPS
25.0000 mg | ORAL_CAPSULE | Freq: Every day | ORAL | 0 refills | Status: DC
  Filled 2020-11-14 – 2020-11-17 (×2): qty 30, 30d supply, fill #0

## 2020-11-17 ENCOUNTER — Other Ambulatory Visit (HOSPITAL_BASED_OUTPATIENT_CLINIC_OR_DEPARTMENT_OTHER): Payer: Self-pay

## 2020-11-21 ENCOUNTER — Encounter: Payer: Self-pay | Admitting: Family Medicine

## 2020-11-24 ENCOUNTER — Other Ambulatory Visit (HOSPITAL_BASED_OUTPATIENT_CLINIC_OR_DEPARTMENT_OTHER): Payer: Self-pay

## 2020-11-24 ENCOUNTER — Other Ambulatory Visit: Payer: Self-pay

## 2020-11-24 MED ORDER — NORTRIPTYLINE HCL 50 MG PO CAPS
50.0000 mg | ORAL_CAPSULE | Freq: Every day | ORAL | 3 refills | Status: DC
  Filled 2020-11-24: qty 30, 30d supply, fill #0
  Filled 2021-03-16: qty 30, 30d supply, fill #1
  Filled 2021-06-19: qty 30, 30d supply, fill #2

## 2020-11-25 ENCOUNTER — Other Ambulatory Visit (HOSPITAL_BASED_OUTPATIENT_CLINIC_OR_DEPARTMENT_OTHER): Payer: Self-pay

## 2020-11-27 ENCOUNTER — Ambulatory Visit (INDEPENDENT_AMBULATORY_CARE_PROVIDER_SITE_OTHER): Payer: No Typology Code available for payment source | Admitting: Psychology

## 2020-11-27 DIAGNOSIS — F4323 Adjustment disorder with mixed anxiety and depressed mood: Secondary | ICD-10-CM | POA: Diagnosis not present

## 2020-12-11 ENCOUNTER — Encounter: Payer: Self-pay | Admitting: Family Medicine

## 2020-12-12 NOTE — Telephone Encounter (Signed)
Please advise 

## 2020-12-15 ENCOUNTER — Ambulatory Visit (INDEPENDENT_AMBULATORY_CARE_PROVIDER_SITE_OTHER): Payer: No Typology Code available for payment source | Admitting: Psychology

## 2020-12-15 DIAGNOSIS — F4323 Adjustment disorder with mixed anxiety and depressed mood: Secondary | ICD-10-CM

## 2020-12-30 ENCOUNTER — Ambulatory Visit (INDEPENDENT_AMBULATORY_CARE_PROVIDER_SITE_OTHER): Payer: No Typology Code available for payment source | Admitting: Family Medicine

## 2020-12-30 ENCOUNTER — Encounter: Payer: Self-pay | Admitting: Family Medicine

## 2020-12-30 ENCOUNTER — Other Ambulatory Visit: Payer: Self-pay

## 2020-12-30 ENCOUNTER — Other Ambulatory Visit (HOSPITAL_BASED_OUTPATIENT_CLINIC_OR_DEPARTMENT_OTHER): Payer: Self-pay

## 2020-12-30 ENCOUNTER — Ambulatory Visit (INDEPENDENT_AMBULATORY_CARE_PROVIDER_SITE_OTHER): Payer: No Typology Code available for payment source | Admitting: Psychology

## 2020-12-30 VITALS — BP 125/83 | HR 109 | Temp 98.3°F | Ht 69.0 in | Wt 205.6 lb

## 2020-12-30 DIAGNOSIS — F5101 Primary insomnia: Secondary | ICD-10-CM

## 2020-12-30 DIAGNOSIS — F4323 Adjustment disorder with mixed anxiety and depressed mood: Secondary | ICD-10-CM

## 2020-12-30 DIAGNOSIS — Z23 Encounter for immunization: Secondary | ICD-10-CM | POA: Diagnosis not present

## 2020-12-30 DIAGNOSIS — G43909 Migraine, unspecified, not intractable, without status migrainosus: Secondary | ICD-10-CM | POA: Insufficient documentation

## 2020-12-30 DIAGNOSIS — F900 Attention-deficit hyperactivity disorder, predominantly inattentive type: Secondary | ICD-10-CM | POA: Diagnosis not present

## 2020-12-30 DIAGNOSIS — G43809 Other migraine, not intractable, without status migrainosus: Secondary | ICD-10-CM

## 2020-12-30 MED ORDER — LISDEXAMFETAMINE DIMESYLATE 20 MG PO CAPS
20.0000 mg | ORAL_CAPSULE | Freq: Every day | ORAL | 0 refills | Status: DC
  Filled 2020-12-30: qty 30, 30d supply, fill #0

## 2020-12-30 NOTE — Progress Notes (Signed)
   Mitchell Bowman is a 25 y.o. male who presents today for an office visit.  Assessment/Plan:  Chronic Problems Addressed Today: Migraine Doing better.  Is unclear if this is due to him stopping Concerta for the effects of the Pamelor.  We will be trying Vyvanse as below and continue current dose of nortriptyline.  He will send a message in a few weeks.  If migraines become worse we can increase dose of nortriptyline as tolerated.  Situational mixed anxiety and depressive disorder Symptoms are about the same as last time.  We switched him to nortriptyline several weeks ago.  He is not sure if this is made much of a difference.  We will continue current dose for now and he will check in with me in a few weeks.  We can increase the dose of nortriptyline as tolerated.  He has continued issues with anxiety/depression despite treatment for his ADHD.  ADHD (attention deficit hyperactivity disorder) Not tolerate Concerta due to migraines.  Discussed potential alternatives.  We will start Vyvanse 20 mg daily.  He will check in with me in a few weeks via MyChart.  We can adjust the dose as needed.   Flu shot given today.     Subjective:  HPI:  He is here to follow up on ADHD. Last saw him 2 months ago. He had increased anxiety at that time and was started on Concerta 27 mg daily for ADHD. This caused issues with migraines He has had issue with migraines in the past. We started him on Nortriptyline again which has been helping.  He is concerned Concerta  might be causing him to have the headaches. He stopped taking Concerta. He reports symptoms seem to be improving.  He has not noticed much difference in anxiety or depressive symptoms the last several weeks.        Objective:  Physical Exam: BP 125/83   Pulse (!) 109   Temp 98.3 F (36.8 C) (Temporal)   Ht 5\' 9"  (1.753 m)   Wt 205 lb 9.6 oz (93.3 kg)   SpO2 97%   BMI 30.36 kg/m   Gen: No acute distress, resting comfortably Neuro:  Grossly normal, moves all extremities Psych: Normal affect and thought content       I,Savera Zaman,acting as a scribe for , MD.,have documented all relevant documentation on the behalf of [name], MD,as directed by  [name], MD while in the presence of [name], MD.   I, [name], MD, have reviewed all documentation for this visit. The documentation on 12/30/20 for the exam, diagnosis, procedures, and orders are all accurate and complete.   13/08/22. [name], MD 12/30/2020 2:20 PM

## 2020-12-30 NOTE — Assessment & Plan Note (Signed)
Doing better.  Is unclear if this is due to him stopping Concerta for the effects of the Pamelor.  We will be trying Vyvanse as below and continue current dose of nortriptyline.  He will send a message in a few weeks.  If migraines become worse we can increase dose of nortriptyline as tolerated.

## 2020-12-30 NOTE — Patient Instructions (Signed)
It was very nice to see you today!  We will give you a flu shot today.  We will switch the Concerta to Vyvanse.  Please send me a message in a few weeks to let me know how this is doing with your ability to stay focused and complete daily tasks.  Please also let me know how you are doing with the anxiety and depression in a few weeks.  We can adjust the dose of the nortriptyline as needed if you are still having issues with either of these things.   Take care, Dr Jimmey Ralph  PLEASE NOTE:  If you had any lab tests please let us know if you have not heard back within a few days. You may see your results on mychart before we have a chance to review them but we will give you a call once they are reviewed by [place]. If we ordered any referrals today, please let us know if you have not heard from their office within the next week.   Please try these tips to maintain a healthy lifestyle:  Eat at least 3 REAL meals and 1-2 snacks per day.  Aim for no more than 5 hours between eating.  If you eat breakfast, please do so within one hour of getting up.   Each meal should contain half fruits/vegetables, one quarter protein, and one quarter carbs (no bigger than a computer mouse)  Cut down on sweet beverages. This includes juice, soda, and sweet tea.   Drink at least 1 glass of water with each meal and aim for at least 8 glasses per day  Exercise at least 150 minutes every week.

## 2020-12-30 NOTE — Assessment & Plan Note (Signed)
Not tolerate Concerta due to migraines.  Discussed potential alternatives.  We will start Vyvanse 20 mg daily.  He will check in with me in a few weeks via MyChart.  We can adjust the dose as needed.

## 2020-12-30 NOTE — Assessment & Plan Note (Signed)
Symptoms are about the same as last time.  We switched him to nortriptyline several weeks ago.  He is not sure if this is made much of a difference.  We will continue current dose for now and he will check in with me in a few weeks.  We can increase the dose of nortriptyline as tolerated.  He has continued issues with anxiety/depression despite treatment for his ADHD.

## 2021-01-21 ENCOUNTER — Ambulatory Visit (INDEPENDENT_AMBULATORY_CARE_PROVIDER_SITE_OTHER): Payer: No Typology Code available for payment source | Admitting: Psychology

## 2021-01-21 DIAGNOSIS — F4323 Adjustment disorder with mixed anxiety and depressed mood: Secondary | ICD-10-CM | POA: Diagnosis not present

## 2021-02-05 ENCOUNTER — Ambulatory Visit (INDEPENDENT_AMBULATORY_CARE_PROVIDER_SITE_OTHER): Payer: No Typology Code available for payment source | Admitting: Psychology

## 2021-02-05 DIAGNOSIS — F4323 Adjustment disorder with mixed anxiety and depressed mood: Secondary | ICD-10-CM | POA: Diagnosis not present

## 2021-02-05 NOTE — Progress Notes (Signed)
[facility] Counselor/Therapist Progress Note  Patient ID: Mitchell Bowman, MRN: [id number],    Date: 02/05/2021  Time Spent: 45 mins  Treatment Type: Individual Therapy  Reported Symptoms: Pt presents for session via webex video, due to virus outbreak.  Pt shares he is in his home with no one else present.  I shared with pt that I am in my home with no one else here with me either.  Pt shares, "I have been good since our last session.  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  We are having inventory issues with not being able to get some supplies for some of the candles and that is frustrating."  Mental Status Exam: Appearance:  Casual     Behavior: Appropriate  Motor: Normal  Speech/Language:  Clear and Coherent  Affect: Appropriate  Mood: normal  Thought process: normal  Thought content:   WNL  Sensory/Perceptual disturbances:   WNL  Orientation: oriented to person, place, and time/date  Attention: Good  Concentration: Good  Memory: WNL  Fund of knowledge:  Good  Insight:   Good  Judgment:  Good  Impulse Control: Good   Risk Assessment: Danger to Self:  No Self-injurious Behavior: No Danger to Others: No Duty to Warn:no Physical Aggression / Violence:No  Access to Firearms a concern: No  Gang Involvement:No   Subjective: Pt shares that they are very frustrated because [employer] Bowman has turned their website off because they "were too far behind in shipping out candles, due to our difficulty to get supplies."  This is frustrating for pt and his mom.  Pt shares they have about 68 orders to process at this point, but are at a standstill until the jars arrive.  Pt is excited about Mitchell Bowman coming to visit.  She is coming on Monday, 12/12 and will be here for at least 2 wks.  Pt shares that the puppy is doing puppy things; getting into lots of things, growing really quickly, and gets along with their other dog well.  Pt  shares that he is playing a new video game recently and he has been enjoying that.  He is able to play this particular game with Mitchell Bowman as well and that makes it more fun for them.  Pt shares that he continues to want to find a full time job that will allow him to make more money.  We talked about the benefits of choosing a solid company to work for, taking an entry level position with them and learning about the company as you work there in order to determine where else in the company you can move to.  Mitchell Bowman is also without a job currently and may start her job search after she returns from her visit here.  Pt shares his mom is doing OK although she is stressed right now with the candle business even though pt is helping out.  Encouraged pt to continue with his self care activities and we will meet in 2 wks for a follow up session.  Interventions: Cognitive Behavioral Therapy  Diagnosis:Adjustment disorder with mixed anxiety and depressed mood  Plan: Treatment Plan Strengths/Abilities:  Intelligent, Intuitive, Willing to participate in therapy Treatment Preferences:  Outpatient Individual Therapy Statement of Needs:  Patient is to use CBT, mindfulness and coping skills to help manage and/or decrease symptoms associated with his diagnosis. Symptoms:  Depressed/Irritable mood, worry, social withdrawal Problems Addressed:  Depressive thoughts, Sadness, Sleep issues,  etc. Long Term Goals:  Pt to reduce overall level, frequency, and intensity of the feelings of depression/anxiety as evidenced by decreased irritability, negative self talk, and helpless feelings from 6 to 7 days/week to 0 to 1 days/week, per client report, for at least 3 consecutive months.  Progress: 30% Short Term Goals:  Pt to verbally express understanding of the relationship between feelings of depression/anxiety and their impact on thinking patterns and behaviors.  Pt to verbalize an understanding of the role that distorted thinking  plays in creating fears, excessive worry, and ruminations.  Progress: 30% Target Date:  02/06/2022 Frequency:  Bi-weekly Modality:  Cognitive Behavioral Therapy Interventions by Therapist:  Therapist will use CBT, Mindfulness exercises, Coping skills and Referrals, as needed by client. Client has verbally approved this treatment plan.  Karie Kirks, [facility]

## 2021-02-13 ENCOUNTER — Other Ambulatory Visit (HOSPITAL_COMMUNITY): Payer: Self-pay

## 2021-02-13 ENCOUNTER — Other Ambulatory Visit (INDEPENDENT_AMBULATORY_CARE_PROVIDER_SITE_OTHER): Payer: Self-pay | Admitting: Family Medicine

## 2021-02-13 DIAGNOSIS — U071 COVID-19: Secondary | ICD-10-CM

## 2021-02-13 MED ORDER — NIRMATRELVIR/RITONAVIR (PAXLOVID)TABLET
3.0000 | ORAL_TABLET | Freq: Two times a day (BID) | ORAL | 0 refills | Status: AC
Start: 2021-02-13 — End: 2021-02-18
  Filled 2021-02-13: qty 30, 5d supply, fill #0

## 2021-02-19 ENCOUNTER — Ambulatory Visit (INDEPENDENT_AMBULATORY_CARE_PROVIDER_SITE_OTHER): Payer: No Typology Code available for payment source | Admitting: Psychology

## 2021-02-19 DIAGNOSIS — F4323 Adjustment disorder with mixed anxiety and depressed mood: Secondary | ICD-10-CM | POA: Diagnosis not present

## 2021-02-19 NOTE — Progress Notes (Signed)
[facility] Counselor/Therapist Progress Note  Patient ID: Mitchell Bowman, MRN: [id number],    Date: 02/19/2021  Time Spent: 30 mins  Treatment Type: Individual Therapy  Reported Symptoms: Pt presents for session via webex video, due to virus outbreak.  Pt shares he is in his home with no one else present.  I shared with pt that I am in my home with no one else here with me either.  Pt shares, "Nimmki is in town and Christmas was good.  Nimmki ended up getting COVID on her way here but she is feeling bette now."  Mental Status Exam: Appearance:  Casual     Behavior: Appropriate  Motor: Normal  Speech/Language:  Clear and Coherent  Affect: Appropriate  Mood: normal  Thought process: normal  Thought content:   WNL  Sensory/Perceptual disturbances:   WNL  Orientation: oriented to person, place, and time/date  Attention: Good  Concentration: Good  Memory: WNL  Fund of knowledge:  Good  Insight:   Good  Judgment:  Good  Impulse Control: Good   Risk Assessment: Danger to Self:  No Self-injurious Behavior: No Danger to Others: No Duty to Warn:no Physical Aggression / Violence:No  Access to Firearms a concern: No  Gang Involvement:No   Subjective: Pt shares that they were not able see his grandparents on Christmas because of Nimmki having COVID.  They just hung around the house; they are hoping to go to the movies tomorrow.  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  They are frustrated by the supply chain issues.  Their [employer] site is back up now that they are more caught up.  Pt shares their puppy is continuing to grow and develop.  She is getting a bit more used to the rules of the house.  Pt shares that he has been spending time with Nimmki, making candles, watching TV, etc for self care.  Pt shares that his mom and Will are doing fine and enjoyed their  Christmas as well.  Will talk with pt more about his intended job search during our next session in 2 wks.  Interventions: Cognitive Behavioral Therapy  Diagnosis:Adjustment disorder with mixed anxiety and depressed mood  Plan: Treatment Plan Strengths/Abilities:  Intelligent, Intuitive, Willing to participate in therapy Treatment Preferences:  Outpatient Individual Therapy Statement of Needs:  Patient is to use CBT, mindfulness and coping skills to help manage and/or decrease symptoms associated with his diagnosis. Symptoms:  Depressed/Irritable mood, worry, social withdrawal Problems Addressed:  Depressive thoughts, Sadness, Sleep issues, etc. Long Term Goals:  Pt to reduce overall level, frequency, and intensity of the feelings of depression/anxiety as evidenced by decreased irritability, negative self talk, and helpless feelings from 6 to 7 days/week to 0 to 1 days/week, per client report, for at least 3 consecutive months.  Progress: 30% Short Term Goals:  Pt to verbally express understanding of the relationship between feelings of depression/anxiety and their impact on thinking patterns and behaviors.  Pt to verbalize an understanding of the role that distorted thinking plays in creating fears, excessive worry, and ruminations.  Progress: 30% Target Date:  02/06/2022 Frequency:  Bi-weekly Modality:  Cognitive Behavioral Therapy Interventions by Therapist:  Therapist will use CBT, Mindfulness exercises, Coping skills and Referrals, as needed by client. Client has verbally approved this treatment plan.  Karie Kirks, [facility]

## 2021-03-06 ENCOUNTER — Ambulatory Visit (INDEPENDENT_AMBULATORY_CARE_PROVIDER_SITE_OTHER): Payer: No Typology Code available for payment source | Admitting: Psychology

## 2021-03-06 DIAGNOSIS — F4323 Adjustment disorder with mixed anxiety and depressed mood: Secondary | ICD-10-CM

## 2021-03-06 NOTE — Progress Notes (Signed)
[facility] Counselor/Therapist Progress Note  Patient ID: Mitchell Bowman, MRN: [id number],    Date: 03/06/2021  Time Spent: 30 mins  Treatment Type: Individual Therapy  Reported Symptoms: Pt presents for session via webex video, due to virus outbreak.  Pt shares he is in his home with no one else present.  I shared with pt that I am in my home with no one else here with me either.  Pt shares, "Nimmki went home on Jan 5th and I have been a little down since then.  The good news is she may be able to come back in March or early April."    Mental Status Exam: Appearance:  Casual     Behavior: Appropriate  Motor: Normal  Speech/Language:  Clear and Coherent  Affect: Appropriate  Mood: normal  Thought process: normal  Thought content:   WNL  Sensory/Perceptual disturbances:   WNL  Orientation: oriented to person, place, and time/date  Attention: Good  Concentration: Good  Memory: WNL  Fund of knowledge:  Good  Insight:   Good  Judgment:  Good  Impulse Control: Good   Risk Assessment: Danger to Self:  No Self-injurious Behavior: No Danger to Others: No Duty to Warn:no Physical Aggression / Violence:No  Access to Firearms a concern: No  Gang Involvement:No   Subjective: 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  They have caught up on the production of the previously ordered candles and only the new ones remain to be filled.  Pt shares that he and Nimmki went to a couple of [employer] and they went to the mall and they went to the movies to see the new "Avatar" movie.  Pt shares that he has been playing video games with friends and Nimmki and talking with her daily as well.  Pt shares that the  puppy continues grow and they are not sure how big she will become.  Pt shares his mom and Will are doing OK; pt shares that his mom's car is having some difficulty (possibly the battery is going bad).  Will recently had to replace his battery as well.  Pt acknowledges that his initial reason for seeking therapy was to gain more understanding about how to and where to look for a good job.  Now that the candle business is going so well, that may be he answer.  We will get together in a month to discuss where he is at that time.  Encouraged pt to continue with his self care activities until that session.     Interventions: Cognitive Behavioral Therapy  Diagnosis:Adjustment disorder with mixed anxiety and depressed mood  Plan: Treatment Plan Strengths/Abilities:  Intelligent, Intuitive, Willing to participate in therapy Treatment Preferences:  Outpatient Individual Therapy Statement of Needs:  Patient is to use CBT, mindfulness and coping skills to help manage and/or decrease symptoms associated with his diagnosis. Symptoms:  Depressed/Irritable mood, worry, social withdrawal Problems Addressed:  Depressive thoughts, Sadness, Sleep issues, etc. Long Term Goals:  Pt to reduce overall level, frequency, and intensity of the feelings of depression/anxiety as evidenced by decreased irritability, negative self talk, and helpless feelings from 6  to 7 days/week to 0 to 1 days/week, per client report, for at least 3 consecutive months.  Progress: 30% Short Term Goals:  Pt to verbally express understanding of the relationship between feelings of depression/anxiety and their impact on thinking patterns and behaviors.  Pt to verbalize an understanding of the role that distorted thinking plays in creating fears, excessive worry, and ruminations.  Progress: 30% Target Date:  02/06/2022 Frequency:  Bi-weekly Modality:  Cognitive Behavioral Therapy Interventions by Therapist:  Therapist will use CBT, Mindfulness  exercises, Coping skills and Referrals, as needed by client. Client has verbally approved this treatment plan.  Karie Kirks, [facility]                  Karie Kirks, [facility]

## 2021-03-16 ENCOUNTER — Other Ambulatory Visit: Payer: Self-pay | Admitting: Family Medicine

## 2021-03-16 ENCOUNTER — Other Ambulatory Visit (HOSPITAL_COMMUNITY): Payer: Self-pay

## 2021-03-17 ENCOUNTER — Other Ambulatory Visit (HOSPITAL_COMMUNITY): Payer: Self-pay

## 2021-03-17 MED ORDER — LISDEXAMFETAMINE DIMESYLATE 20 MG PO CAPS
20.0000 mg | ORAL_CAPSULE | Freq: Every day | ORAL | 0 refills | Status: DC
  Filled 2021-03-17: qty 30, 30d supply, fill #0

## 2021-04-09 ENCOUNTER — Ambulatory Visit (INDEPENDENT_AMBULATORY_CARE_PROVIDER_SITE_OTHER): Payer: No Typology Code available for payment source | Admitting: Psychology

## 2021-04-09 DIAGNOSIS — F4323 Adjustment disorder with mixed anxiety and depressed mood: Secondary | ICD-10-CM | POA: Diagnosis not present

## 2021-04-09 NOTE — Progress Notes (Addendum)
[facility] Counselor/Therapist Progress Note  Patient ID: CYRIL PROBUS, MRN: [id number],    Date: 04/09/2021  Time Spent: 45 mins  Treatment Type: Individual Therapy  Reported Symptoms: Pt presents for session via webex video, due to virus outbreak.  Pt shares he is in his home with no one else present.  I shared with pt that I am in my home with no one else here with me either.     Mental Status Exam: Appearance:  Casual     Behavior: Appropriate  Motor: Normal  Speech/Language:  Clear and Coherent  Affect: Appropriate  Mood: normal  Thought process: normal  Thought content:   WNL  Sensory/Perceptual disturbances:   WNL  Orientation: oriented to person, place, and time/date  Attention: Good  Concentration: Good  Memory: WNL  Fund of knowledge:  Good  Insight:   Good  Judgment:  Good  Impulse Control: Good   Risk Assessment: Danger to Self:  No Self-injurious Behavior: No Danger to Others: No Duty to Warn:no Physical Aggression / Violence:No  Access to Firearms a concern: No  Gang Involvement:No   Subjective: 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  They have continued to be very busy in the past month.  They have chosen to increase their prices recently and orders continue to come in in spite of the increase.  They needed to raise their prices in order to keep up with increases in expenses.  He is spending most of his time making candles since our last session.  Pt shares he is trying to get used to waking up earlier in his day; we talked about ways to establish a morning routine for himself  to get into his day more effectively.  He shares that his mom sent in this week a doggy DNA test to see what their new puppy has in her makeup.  Pt shares his sleep quality is pretty good but sometimes he has trouble falling asleep.  We talked about the processes he uses to prepare for bed.  Encouraged pt to try to establish a process for him to get ready for bed at  night.  He will work on this between now and our next session; he will also try to develop a process for getting up and getting ready for his day in the mornings.  Pt shares his self care activities have been video games and computer games, has been practicing his Rubix Cube, and talking to Nimmki daily.  Pt shares that his mom is planning to change healthcare jobs in April.  Encouraged pt to continue with his self care activities and we will meet in 4 wks for a follow up session.  Interventions: Cognitive Behavioral Therapy  Diagnosis:Adjustment disorder with mixed anxiety and depressed mood  Plan: Treatment Plan Strengths/Abilities:  Intelligent, Intuitive, Willing to participate in therapy Treatment Preferences:  Outpatient Individual Therapy Statement of Needs:  Patient is to use CBT, mindfulness and coping skills to help manage and/or decrease symptoms associated with his diagnosis. Symptoms:  Depressed/Irritable mood, worry, social withdrawal Problems Addressed:  Depressive thoughts, Sadness, Sleep issues, etc. Long Term Goals:  Pt to reduce overall level, frequency, and intensity of the feelings of depression/anxiety as evidenced by decreased irritability, negative self talk, and helpless feelings from 6 to 7 days/week to 0 to 1 days/week, per client report, for at least 3 consecutive months.  Progress: 30% Short Term Goals:  Pt to verbally express understanding of the relationship between feelings of  depression/anxiety and their impact on thinking patterns and behaviors.  Pt to verbalize an understanding of the role that distorted thinking plays in creating fears, excessive worry, and ruminations.  Progress: 30% Target Date:  02/06/2022 Frequency:  Bi-weekly Modality:  Cognitive Behavioral Therapy Interventions by Therapist:  Therapist will use CBT, Mindfulness exercises, Coping skills and Referrals, as needed by client. Client has verbally approved this treatment plan.  Ivan Anchors,  [facility]

## 2021-05-06 ENCOUNTER — Ambulatory Visit (INDEPENDENT_AMBULATORY_CARE_PROVIDER_SITE_OTHER): Payer: No Typology Code available for payment source | Admitting: Psychology

## 2021-05-06 DIAGNOSIS — F4323 Adjustment disorder with mixed anxiety and depressed mood: Secondary | ICD-10-CM

## 2021-05-06 NOTE — Progress Notes (Signed)
[facility] Counselor/Therapist Progress Note ? ?Patient ID: Mitchell Bowman, MRN: [id number],   ? ?Date: 05/06/2021 ? ?Time Spent: 45 mins ? ?Treatment Type: Individual Therapy ? ?Reported Symptoms: Pt presents for session via webex video, due to virus outbreak.  Pt shares he is in his home with no one else present.  I shared with pt that I am in my home with no one else here with me either.    ? ?Mental Status Exam: ?Appearance:  Casual     ?Behavior: Appropriate  ?Motor: Normal  ?Speech/Language:  Clear and Coherent  ?Affect: Appropriate  ?Mood: normal  ?Thought process: normal  ?Thought content:   WNL  ?Sensory/Perceptual disturbances:   WNL  ?Orientation: oriented to person, place, and time/date  ?Attention: Good  ?Concentration: Good  ?Memory: WNL  ?Fund of knowledge:  Good  ?Insight:   Good  ?Judgment:  Good  ?Impulse Control: Good  ? ?Risk Assessment: ?Danger to Self:  No ?Self-injurious Behavior: No ?Danger to Others: No ?Duty to Warn:no ?Physical Aggression / Violence:No  ?Access to Firearms a concern: No  ?Gang Involvement:No  ? 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  They have been having some struggles with [employer] but it is now in better shape.  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  Pt shares that he is planning to get a guitar for his birthday next month and learning to play it.  He is continuing to plan video games online with friends as well.  Pt shares that he and Nimki continue to talk daily; she is hoping to come back for a visit in August.  She and her family have gotten two new kittens since our last session; they also have a young puppy (63 months old).  Pt shares that they got the DNA results for their new puppy (she is mostly [place] and [place]).  The puppy is doing well and continuing to grow quickly.  Pt also shares that he continues to struggle with his sleep hygiene; he has been trying to establish a good schedule and good habits that will allow him to fall asleep better and that seems to help pt.  He is getting up with greater purpose and feels that is helping him.  Pt shares that he is sleeping pretty well.  Encouraged pt to continue with his self care activities and we will meet in 4 wks for a follow up session. ?   ?Interventions: Cognitive Behavioral Therapy ? ?Diagnosis:Adjustment disorder with mixed anxiety and depressed mood ? ?Plan: Treatment Plan ?Strengths/Abilities:  Intelligent, Intuitive, Willing to participate in therapy ?Treatment Preferences:  Outpatient Individual Therapy ?Statement of Needs:  Patient is to use CBT, mindfulness and coping skills to help manage and/or decrease symptoms associated with his diagnosis. ?Symptoms:  Depressed/Irritable mood, worry, social withdrawal ?Problems Addressed:  Depressive thoughts, Sadness, Sleep issues, etc. ?Long Term Goals:  Pt to reduce overall level, frequency, and intensity of the feelings of depression/anxiety as evidenced by decreased irritability, negative self talk, and helpless feelings from 6 to 7 days/week to 0 to 1 days/week, per client report, for at least 3 consecutive months.  Progress: 30% ?Short Term Goals:  Pt to verbally express understanding of the relationship between feelings of depression/anxiety and their impact on thinking patterns and behaviors.  Pt to verbalize an understanding of the role that distorted thinking plays in creating fears, excessive worry, and ruminations.  Progress: 30% ?Target Date:  02/06/2022 ?Frequency:  Monthly ?Modality:  Cognitive Behavioral Therapy ?Interventions by Therapist:  Therapist will use CBT, Mindfulness exercises, Coping skills and Referrals, as needed by client. ?Client has verbally approved this treatment  plan. ? ?Karie Kirks, [facility] ?

## 2021-06-10 ENCOUNTER — Ambulatory Visit (INDEPENDENT_AMBULATORY_CARE_PROVIDER_SITE_OTHER): Payer: No Typology Code available for payment source | Admitting: Psychology

## 2021-06-10 DIAGNOSIS — F4323 Adjustment disorder with mixed anxiety and depressed mood: Secondary | ICD-10-CM | POA: Diagnosis not present

## 2021-06-10 NOTE — Progress Notes (Signed)
[facility] Counselor/Therapist Progress Note ? ?Patient ID: Mitchell Bowman, MRN: [id number],   ? ?Date: 06/10/2021 ? ?Time Spent: 45 mins ? ?Treatment Type: Individual Therapy ? ?Reported Symptoms: Pt presents for session via webex video, due to virus outbreak.  Pt shares he is in his home with no one else present.  I shared with pt that I am in my home with no one else here with me either.    ? ?Mental Status Exam: ?Appearance:  Casual     ?Behavior: Appropriate  ?Motor: Normal  ?Speech/Language:  Clear and Coherent  ?Affect: Appropriate  ?Mood: normal  ?Thought process: normal  ?Thought content:   WNL  ?Sensory/Perceptual disturbances:   WNL  ?Orientation: oriented to person, place, and time/date  ?Attention: Good  ?Concentration: Good  ?Memory: WNL  ?Fund of knowledge:  Good  ?Insight:   Good  ?Judgment:  Good  ?Impulse Control: Good  ? ?Risk Assessment: ?Danger to Self:  No ?Self-injurious Behavior: No ?Danger to Others: No ?Duty to Warn:no ?Physical Aggression / Violence:No  ?Access to Firearms a concern: No  ?Gang Involvement:No  ? ?Subjective: Pt shares that he recently turned 26 yo and he is aware that he is aging although he does not "feel old."  He celebrated with his mom and Will; he shares they got him gifts as did Nimki.  He also shares that his mom has now taken a new job with [employer] (started on Monday; she will be a scribe for a provider); she moved outside of the [facility] system for this job.  Pt shares that he needs to take a break from therapy because of his mom has new insurance but it is not in effect yet.   Pt spent time with his grandfather recently to get a new shower door for their house; he enjoyed spending time with his grandparents.  Pt shares that he continues to talk with Nimki regularly; she has recently gotten a job with a [employer]; her first day is to be tomorrow.  Pt continues to work on some candles; the orders have started to slow down a bit  recently.  Pt got a guitar for his birthday and he is reviewing online lessons to try to learn a little bit about playing.  He is looking forward learning more and more.  He appreciated band in HS and is looking forward to reconnecting with music again.  The dogs are doing well; the puppy is learning some things but he does not feel responsible for training her.  Pt shares he has been going to bed a reasonable times and is resting pretty well.  Encouraged pt to continue with his self care activities and he will call the office for a follow up session when his mom's new insurance kicks in. ?   ?Interventions: Cognitive Behavioral Therapy ? ?Diagnosis:Adjustment disorder with mixed anxiety and depressed mood ? ?Plan: Treatment Plan ?Strengths/Abilities:  Intelligent, Intuitive, Willing to participate in therapy ?Treatment Preferences:  Outpatient Individual Therapy ?Statement of Needs:  Patient is to use CBT, mindfulness and coping skills to help manage and/or decrease symptoms associated with his diagnosis. ?Symptoms:  Depressed/Irritable mood, worry, social withdrawal ?Problems Addressed:  Depressive thoughts, Sadness, Sleep issues, etc. ?Long Term Goals:  Pt to reduce overall level, frequency, and intensity of the feelings of depression/anxiety as evidenced by decreased irritability, negative self talk, and helpless feelings from 6 to 7 days/week to 0 to 1 days/week, per client report, for at  least 3 consecutive months.  Progress: 30% ?Short Term Goals:  Pt to verbally express understanding of the relationship between feelings of depression/anxiety and their impact on thinking patterns and behaviors.  Pt to verbalize an understanding of the role that distorted thinking plays in creating fears, excessive worry, and ruminations.  Progress: 30% ?Target Date:  02/06/2022 ?Frequency:  Monthly ?Modality:  Cognitive Behavioral Therapy ?Interventions by Therapist:  Therapist will use CBT, Mindfulness exercises, Coping  skills and Referrals, as needed by client. ?Client has verbally approved this treatment plan. ? ?Ivan Anchors, [facility] ?

## 2021-06-19 ENCOUNTER — Other Ambulatory Visit (HOSPITAL_COMMUNITY): Payer: Self-pay

## 2021-06-19 ENCOUNTER — Other Ambulatory Visit: Payer: Self-pay | Admitting: Family Medicine

## 2021-06-19 MED ORDER — LISDEXAMFETAMINE DIMESYLATE 20 MG PO CAPS
20.0000 mg | ORAL_CAPSULE | Freq: Every day | ORAL | 0 refills | Status: DC
Start: 2021-06-19 — End: 2022-09-02
  Filled 2021-06-19: qty 90, 90d supply, fill #0

## 2021-06-19 NOTE — Telephone Encounter (Signed)
Please see message from patient: I am not sure if you can send 90 day supply? ?Patient comment: Hi Dr. Jimmey Ralph,  I meant to message you sooner, but honestly lots of things kept distracting me.   My mom has gotten a new job that?s outside [place], and our insurance will end at the end of this month (Sunday). Our new insurance will not take effect for 2 months. I was hoping I could get refills for my medications to last until our new insurance will take effect?  I am sorry this is last minute. If you can?t do this, I understand.   Thank you,  Mitchell Bowman ?

## 2021-09-30 ENCOUNTER — Encounter (INDEPENDENT_AMBULATORY_CARE_PROVIDER_SITE_OTHER): Payer: Self-pay

## 2021-11-16 ENCOUNTER — Encounter: Payer: Self-pay | Admitting: *Deleted

## 2022-02-04 ENCOUNTER — Encounter: Payer: Self-pay | Admitting: *Deleted

## 2022-04-23 DIAGNOSIS — Z419 Encounter for procedure for purposes other than remedying health state, unspecified: Secondary | ICD-10-CM | POA: Diagnosis not present

## 2022-05-24 DIAGNOSIS — Z419 Encounter for procedure for purposes other than remedying health state, unspecified: Secondary | ICD-10-CM | POA: Diagnosis not present

## 2022-06-23 DIAGNOSIS — Z419 Encounter for procedure for purposes other than remedying health state, unspecified: Secondary | ICD-10-CM | POA: Diagnosis not present

## 2022-07-24 DIAGNOSIS — Z419 Encounter for procedure for purposes other than remedying health state, unspecified: Secondary | ICD-10-CM | POA: Diagnosis not present

## 2022-08-23 DIAGNOSIS — Z419 Encounter for procedure for purposes other than remedying health state, unspecified: Secondary | ICD-10-CM | POA: Diagnosis not present

## 2022-08-30 ENCOUNTER — Encounter: Payer: Self-pay | Admitting: Family Medicine

## 2022-08-30 NOTE — Telephone Encounter (Signed)
Unable to reach patient , Lvm for pt to call back to schedule an ov

## 2022-08-30 NOTE — Telephone Encounter (Signed)
Patient is scheduled with PCP on 09/02/22

## 2022-08-30 NOTE — Telephone Encounter (Signed)
Please schedule an OV with PCP  

## 2022-09-02 ENCOUNTER — Ambulatory Visit (INDEPENDENT_AMBULATORY_CARE_PROVIDER_SITE_OTHER): Payer: Medicaid Other | Admitting: Family Medicine

## 2022-09-02 ENCOUNTER — Encounter: Payer: Self-pay | Admitting: Family Medicine

## 2022-09-02 ENCOUNTER — Other Ambulatory Visit (HOSPITAL_BASED_OUTPATIENT_CLINIC_OR_DEPARTMENT_OTHER): Payer: Self-pay

## 2022-09-02 VITALS — BP 110/76 | HR 79 | Temp 98.1°F | Resp 16 | Wt 201.0 lb

## 2022-09-02 DIAGNOSIS — F900 Attention-deficit hyperactivity disorder, predominantly inattentive type: Secondary | ICD-10-CM

## 2022-09-02 DIAGNOSIS — N50811 Right testicular pain: Secondary | ICD-10-CM | POA: Diagnosis not present

## 2022-09-02 DIAGNOSIS — K200 Eosinophilic esophagitis: Secondary | ICD-10-CM | POA: Insufficient documentation

## 2022-09-02 DIAGNOSIS — N50812 Left testicular pain: Secondary | ICD-10-CM

## 2022-09-02 DIAGNOSIS — F4323 Adjustment disorder with mixed anxiety and depressed mood: Secondary | ICD-10-CM

## 2022-09-02 MED ORDER — ESOMEPRAZOLE MAGNESIUM 40 MG PO PACK
40.0000 mg | PACK | Freq: Every day | ORAL | 12 refills | Status: DC
  Filled 2022-09-02: qty 30, 30d supply, fill #0

## 2022-09-02 MED ORDER — SERTRALINE HCL 50 MG PO TABS
50.0000 mg | ORAL_TABLET | Freq: Every day | ORAL | 3 refills | Status: DC
  Filled 2022-09-02: qty 90, 90d supply, fill #0
  Filled 2022-12-09: qty 90, 90d supply, fill #1
  Filled 2023-03-18: qty 90, 90d supply, fill #2
  Filled 2023-06-13 – 2023-06-21 (×2): qty 90, 90d supply, fill #3

## 2022-09-02 NOTE — Assessment & Plan Note (Signed)
They may be interested in starting biologic agent for this.  Will refer to gastroenterology.  May need repeat EGD.  Will refill Nexium today.

## 2022-09-02 NOTE — Assessment & Plan Note (Signed)
Symptoms are still not controlled though discussed with patient this may be due to a lot of his underlying anxiety.  Will be treating his anxiety first as above.  Once anxiety is under good control would be reasonable to restart Vyvanse.  He was not sure if Vyvanse 20 mg daily was effective in the past.  We can start back at this dose with 30 mg dose depending on severity of symptoms once his anxiety is better controlled.

## 2022-09-02 NOTE — Progress Notes (Signed)
Mitchell Bowman is a 27 y.o. male who presents today for an office visit.  Assessment/Plan:  New/Acute Problems: Testicular Pain Reassuring exam today.  No signs of hernia.  No appreciable lumps or masses.  Will check ultrasound to further evaluate.  Chronic Problems Addressed Today: Situational mixed anxiety and depressive disorder Symptoms are not controlled.  We did discuss treatment options.  He is interested in both medications and therapy.  Will refer for him to see a counselor.  We discussed medication treatment options.  He is not sure how he did with Celexa or nortriptyline in the past however does think that they may have not been a high enough dose.  His mother is on Zoloft and does well with this.  Will start Zoloft 50 mg daily.  They will send [place] a message in a few weeks and we can increase the dose as tolerated.  Discussed with patient that we should give a several week trial at higher doses before determining if medications are effective or not.  ADHD (attention deficit hyperactivity disorder) Symptoms are still not controlled though discussed with patient this may be due to a lot of his underlying anxiety.  Will be treating his anxiety first as above.  Once anxiety is under good control would be reasonable to restart Vyvanse.  He was not sure if Vyvanse 20 mg daily was effective in the past.  We can start back at this dose with 30 mg dose depending on severity of symptoms once his anxiety is better controlled.  Eosinophilic esophagitis They may be interested in starting biologic agent for this.  Will refer to gastroenterology.  May need repeat EGD.  Will refill Nexium today.     Subjective:  HPI:  See Assessment / plan for status of chronic conditions.   Patient last seen here about a year and a half ago.  At our last visit we had discussed his anxiety, depression, and ADHD.  We started him on Vyvanse 20 mg daily for the ADHD.  We also continued him on nortriptyline.  We  also did refer him to see a counselor.  He had several sessions with them but is not sure if it was effective.  He has not been on any medications for the last year or so.  He is not sure if this was effective.  He does feel like anxiety has been worsening over the last month or so.  He is interested in restarting medication and interested in seeing counselor again.  No reported SI or HI.  Anxiety has made it difficult for him to do the things that he would like to do and needs to do.  We had him on Celexa previously as well however he is not sure if this was effective.  He thinks it may have been due to the dose not being high enough.  He has been having testicular pain for the last 2 years.  Comes and goes. Located in both testicles. Throbbing sensation. Happens on a daily. No lumps or bumps.   He has also had some difficulty with swallowing. He was diagnosed with EOE several years ago. He did have an EGD several years ago that confirmed this. He has been doing relatively well until recently the last several weeks. Starting to have more reflux symptoms recently.  No dysphagia. He would like to be referred to gastroenterology.        Objective:  Physical Exam: BP 110/76 (BP Location: Right Arm, Patient Position: Sitting,  Cuff Size: Large)   Pulse 79   Temp 98.1 F (36.7 C) (Temporal)   Resp 16   Wt 201 lb (91.2 kg)   SpO2 96%   BMI 29.68 kg/m   Gen: No acute distress, resting comfortably CV: Regular rate and rhythm with no murmurs appreciated Pulm: Normal work of breathing, clear to auscultation bilaterally with no crackles, wheezes, or rhonchi GU: Normal male genitalia.  No appreciable lumps or masses.  No appreciable hernias. Neuro: Grossly normal, moves all extremities Psych: Normal affect and thought content  Time Spent: 40 minutes of total time was spent on the date of the encounter performing the following actions: chart review prior to seeing the patient including previous  visits, obtaining history, performing a medically necessary exam, counseling on the treatment plan, placing orders, and documenting in our EHR.         [name]. Jimmey Ralph, MD 09/02/2022 12:31 PM

## 2022-09-02 NOTE — Patient Instructions (Addendum)
It was very nice to see you today!  I will refer you to gastroenterology for your EOE.  I will refill your Nexium.  Please start Zoloft 50 mg daily.  We will refer you to see a counselor.  Send me a message in a couple of weeks to let me know how you are doing and we can go up on the dose of your Zoloft if needed.  We will get an ultrasound of your scrotum.   Return in about 3 months (around 12/03/2022).   Take care, Dr Jimmey Ralph  PLEASE NOTE:  If you had any lab tests, please let us know if you have not heard back within a few days. You may see your results on mychart before we have a chance to review them but we will give you a call once they are reviewed by [place].   If we ordered any referrals today, please let us know if you have not heard from their office within the next week.   If you had any urgent prescriptions sent in today, please check with the pharmacy within an hour of our visit to make sure the prescription was transmitted appropriately.   Please try these tips to maintain a healthy lifestyle:  Eat at least 3 REAL meals and 1-2 snacks per day.  Aim for no more than 5 hours between eating.  If you eat breakfast, please do so within one hour of getting up.   Each meal should contain half fruits/vegetables, one quarter protein, and one quarter carbs (no bigger than a computer mouse)  Cut down on sweet beverages. This includes juice, soda, and sweet tea.   Drink at least 1 glass of water with each meal and aim for at least 8 glasses per day  Exercise at least 150 minutes every week.

## 2022-09-02 NOTE — Assessment & Plan Note (Signed)
Symptoms are not controlled.  We did discuss treatment options.  He is interested in both medications and therapy.  Will refer for him to see a counselor.  We discussed medication treatment options.  He is not sure how he did with Celexa or nortriptyline in the past however does think that they may have not been a high enough dose.  His mother is on Zoloft and does well with this.  Will start Zoloft 50 mg daily.  They will send [place] a message in a few weeks and we can increase the dose as tolerated.  Discussed with patient that we should give a several week trial at higher doses before determining if medications are effective or not.

## 2022-09-03 ENCOUNTER — Other Ambulatory Visit: Payer: Self-pay | Admitting: *Deleted

## 2022-09-03 ENCOUNTER — Other Ambulatory Visit (HOSPITAL_BASED_OUTPATIENT_CLINIC_OR_DEPARTMENT_OTHER): Payer: Self-pay

## 2022-09-03 MED ORDER — ESOMEPRAZOLE MAGNESIUM 40 MG PO CPDR
40.0000 mg | DELAYED_RELEASE_CAPSULE | Freq: Every day | ORAL | 12 refills | Status: DC
  Filled 2022-09-03: qty 30, 30d supply, fill #0
  Filled 2022-10-20: qty 30, 30d supply, fill #1

## 2022-09-06 ENCOUNTER — Encounter: Payer: Self-pay | Admitting: Family Medicine

## 2022-09-06 ENCOUNTER — Ambulatory Visit: Payer: Medicaid Other | Admitting: Family

## 2022-09-06 VITALS — BP 134/91 | HR 70 | Temp 98.0°F | Ht 69.0 in | Wt 202.5 lb

## 2022-09-06 DIAGNOSIS — M545 Low back pain, unspecified: Secondary | ICD-10-CM

## 2022-09-06 NOTE — Progress Notes (Signed)
   Patient ID: Mitchell Bowman, male    DOB: [birth year], 27 y.o.   MRN: [id number]  Chief Complaint  Patient presents with   Back Pain    Pt c/o achy/burning sensations in  lower back pain since last week, No injury. Pt states he tried sitting on a cushion which did help slightly. Also tried meloxicam but didn't notice a difference.    HPI:      Low back/tailbone pain:  started about a week ago, reports achy, burning pain all the time, no worse with sitting, walking or standing. States he did try sitting on a cushion which seemed to help a little.  Denies any tingling & numbness. Denies any injury, no excessive exercise or stretches, no heavy lifting. Reports he is unemployed right now & sitting around a lot at home.  Assessment & Plan:  1. Lumbosacral pain - no pain w/palpation, but area of pain is more in sacral area. advised on taking Ibuprofen up to 600mg  bid-tid for next 5 days, apply heat via heating pad or patches for up to tid, look for OTC Lidocaine patches and apply tid prn for pain. Important to avoid sitting for long periods, stand, stretch, & walk every hour. Call back if pain not improved after 2 weeks.   Subjective:    Outpatient Medications Prior to Visit  Medication Sig Dispense Refill   esomeprazole (NEXIUM) 40 MG capsule Take 1 capsule (40 mg total) by mouth daily at 12 noon. 30 capsule 12   sertraline (ZOLOFT) 50 MG tablet Take 1 tablet (50 mg total) by mouth daily. 90 tablet 3   No facility-administered medications prior to visit.   Past Medical History:  Diagnosis Date   ADHD (attention deficit hyperactivity disorder)    Allergy    Asthma    Wrist fracture    No past surgical history on file. No Known Allergies    Objective:    Physical Exam Vitals and nursing note reviewed.  Constitutional:      General: He is not in acute distress.    Appearance: Normal appearance.  HENT:     Head: Normocephalic.  Cardiovascular:     Rate and Rhythm: Normal  rate and regular rhythm.  Pulmonary:     Effort: Pulmonary effort is normal.     Breath sounds: Normal breath sounds.  Musculoskeletal:        General: Normal range of motion.     Cervical back: Normal range of motion.     Lumbar back: No swelling, tenderness or bony tenderness.       Back:  Skin:    General: Skin is warm and dry.  Neurological:     Mental Status: He is alert and oriented to person, place, and time.  Psychiatric:        Mood and Affect: Mood normal.    BP (!) 134/91   Pulse 70   Temp 98 F (36.7 C) (Temporal)   Ht 5\' 9"  (1.753 m)   Wt 202 lb 8 oz (91.9 kg)   SpO2 98%   BMI 29.90 kg/m  Wt Readings from Last 3 Encounters:  09/06/22 202 lb 8 oz (91.9 kg)  09/02/22 201 lb (91.2 kg)  12/30/20 205 lb 9.6 oz (93.3 kg)       Dulce Sellar, NP

## 2022-09-06 NOTE — Patient Instructions (Signed)
It was very nice to see you today!    Take up to 3 Ibuprofen 2-3 times per day for the next 4-5 days. You can apply heat patches or a heating pad for up to 30 minutes several times per day. Look for Lidocaine pain relief patches over the counter and apply per package directions. Avoid sitting for too long, stand and walk around every hour. Avoid any activity that makes the pain worse.  Let us know if not any better after 1-2 weeks.       PLEASE NOTE:  If you had any lab tests please let us know if you have not heard back within a few days. You may see your results on MyChart before we have a chance to review them but we will give you a call once they are reviewed by [place]. If we ordered any referrals today, please let us know if you have not heard from their office within the next week.

## 2022-09-08 ENCOUNTER — Ambulatory Visit (HOSPITAL_BASED_OUTPATIENT_CLINIC_OR_DEPARTMENT_OTHER)
Admission: RE | Admit: 2022-09-08 | Discharge: 2022-09-08 | Disposition: A | Discharge status: Home / Self Care | Payer: Medicaid Other | Source: Ambulatory Visit | Attending: Family Medicine | Admitting: Family Medicine

## 2022-09-08 ENCOUNTER — Encounter: Payer: Self-pay | Admitting: Physician Assistant

## 2022-09-08 DIAGNOSIS — N50811 Right testicular pain: Secondary | ICD-10-CM | POA: Insufficient documentation

## 2022-09-08 DIAGNOSIS — N50812 Left testicular pain: Secondary | ICD-10-CM | POA: Insufficient documentation

## 2022-09-09 NOTE — Progress Notes (Signed)
Good news! His ultrasound is normal. No signs of masses or other abnormalities.  If he is still having significant pain then recommend urology referral.  [name]. Jimmey Ralph, MD 09/09/2022 11:46 PM

## 2022-09-15 ENCOUNTER — Encounter: Payer: Self-pay | Admitting: Family Medicine

## 2022-09-16 ENCOUNTER — Other Ambulatory Visit: Payer: Self-pay | Admitting: *Deleted

## 2022-09-16 DIAGNOSIS — N50812 Left testicular pain: Secondary | ICD-10-CM

## 2022-09-16 NOTE — Telephone Encounter (Signed)
See note  Urology referral placed

## 2022-09-20 NOTE — Telephone Encounter (Signed)
I appreciate the update. Please let him know the urology referral is in place.  I am glad to hear that anxiety is improving.  He should have continued improvement over the next several weeks.  Would like for him to check back with Erie Noe in a few weeks.

## 2022-09-23 DIAGNOSIS — Z419 Encounter for procedure for purposes other than remedying health state, unspecified: Secondary | ICD-10-CM | POA: Diagnosis not present

## 2022-10-06 ENCOUNTER — Encounter: Payer: Self-pay | Admitting: Family Medicine

## 2022-10-08 ENCOUNTER — Telehealth: Payer: Self-pay | Admitting: Urology

## 2022-10-08 NOTE — Telephone Encounter (Signed)
mother called to make an appt. Called back and LVM to call us to sch. just need referral changed to our office

## 2022-10-11 NOTE — Telephone Encounter (Signed)
Send note to [facility] referral coordination

## 2022-10-20 ENCOUNTER — Encounter: Payer: Self-pay | Admitting: Urology

## 2022-10-20 ENCOUNTER — Other Ambulatory Visit (HOSPITAL_BASED_OUTPATIENT_CLINIC_OR_DEPARTMENT_OTHER): Payer: Self-pay

## 2022-10-20 ENCOUNTER — Ambulatory Visit (INDEPENDENT_AMBULATORY_CARE_PROVIDER_SITE_OTHER): Payer: Medicaid Other | Admitting: Urology

## 2022-10-20 VITALS — BP 147/91 | HR 76 | Ht 70.0 in | Wt 204.0 lb

## 2022-10-20 DIAGNOSIS — N50812 Left testicular pain: Secondary | ICD-10-CM | POA: Diagnosis not present

## 2022-10-20 DIAGNOSIS — N50819 Testicular pain, unspecified: Secondary | ICD-10-CM

## 2022-10-20 DIAGNOSIS — N50811 Right testicular pain: Secondary | ICD-10-CM

## 2022-10-20 LAB — URINALYSIS, ROUTINE W REFLEX MICROSCOPIC
Bilirubin, UA: NEGATIVE
Glucose, UA: NEGATIVE
Ketones, UA: NEGATIVE
Leukocytes,UA: NEGATIVE
Nitrite, UA: NEGATIVE
Protein,UA: NEGATIVE
RBC, UA: NEGATIVE
Specific Gravity, UA: 1.02 (ref 1.005–1.030)
Urobilinogen, Ur: 0.2 mg/dL (ref 0.2–1.0)
pH, UA: 7 (ref 5.0–7.5)

## 2022-10-20 NOTE — Progress Notes (Signed)
   Assessment: 1. Pain in testicle, unspecified laterality      Plan: Reassurance and conservative measures discussed with patient at length today. Discussed scrotal supports, hot sitz bath's, and nonsteroidals as needed Follow-up as needed  Chief Complaint: testis pain  History of Present Illness:  Mitchell Bowman is a 27 y.o. male with PMHX of anxiety, depression, ADHD and esophagitis who is seen in consultation from Ardith Dark, MD for evaluation of testis pain. Patient reports that over the last 3 years he has had issues with bilateral testicular discomfort.  He states that it is somewhat of a mild achy type pain but is noticeable on most days.  It typically goes away on its own without any intervention. No history of overt trauma.  Patient denies other associated symptoms.  He denies any groin pain, perineal pain, or pain with ejaculation.  Patient underwent scrotal ultrasound 09/08/2022 which I reviewed today which shows completely normal findings. GU exam was also normal without evidence of hernia.  Past Medical History:  Past Medical History:  Diagnosis Date   ADHD (attention deficit hyperactivity disorder)    Allergy    Asthma    Wrist fracture     Past Surgical History:  No past surgical history on file.  Allergies:  No Known Allergies  Family History:  No family history on file.  Social History:  Social History   Tobacco Use   Smoking status: Never   Smokeless tobacco: Never  Substance Use Topics   Alcohol use: No   Drug use: No    Review of symptoms:  Constitutional:  Negative for unexplained weight loss, night sweats, fever, chills ENT:  Negative for nose bleeds, sinus pain, painful swallowing CV:  Negative for chest pain, shortness of breath, exercise intolerance, palpitations, loss of consciousness Resp:  Negative for cough, wheezing, shortness of breath GI:  Negative for nausea, vomiting, diarrhea, bloody stools GU:  Positives noted in HPI;  otherwise negative for gross hematuria, dysuria, urinary incontinence Neuro:  Negative for seizures, poor balance, limb weakness, slurred speech Psych:  Negative for lack of energy, depression, anxiety Endocrine:  Negative for polydipsia, polyuria, symptoms of hypoglycemia (dizziness, hunger, sweating) Hematologic:  Negative for anemia, purpura, petechia, prolonged or excessive bleeding, use of anticoagulants  Allergic:  Negative for difficulty breathing or choking as a result of exposure to anything; no shellfish allergy; no allergic response (rash/itch) to materials, foods  Physical exam: There were no vitals taken for this visit. GENERAL APPEARANCE:  Well appearing, well developed, well nourished, NAD  GU: Normal circumcised phallus.  Normal testes and cords bilaterally.  No evidence of hernia.

## 2022-10-24 DIAGNOSIS — Z419 Encounter for procedure for purposes other than remedying health state, unspecified: Secondary | ICD-10-CM | POA: Diagnosis not present

## 2022-11-10 ENCOUNTER — Ambulatory Visit (INDEPENDENT_AMBULATORY_CARE_PROVIDER_SITE_OTHER): Payer: Medicaid Other | Admitting: Physician Assistant

## 2022-11-10 ENCOUNTER — Encounter: Payer: Self-pay | Admitting: Physician Assistant

## 2022-11-10 DIAGNOSIS — R1310 Dysphagia, unspecified: Secondary | ICD-10-CM

## 2022-11-10 DIAGNOSIS — K219 Gastro-esophageal reflux disease without esophagitis: Secondary | ICD-10-CM

## 2022-11-10 NOTE — Progress Notes (Signed)
Chief Complaint: Dysphagia with a History of eosinophilic esophagitis  HPI:     Mitchell Bowman is a 27 y/o male with a past medical history as listed below, who was referred to me by Ardith Dark, MD for a complaint of dysphagia with a history of eosinophilic esophagitis.      [facility] [place] done for episodes of food impaction in the esophagus.  At that time endoscopic diagnosis of probable eosinophilic esophagitis.  (I cannot see biopsy results).    11/21/2009 patient followed up with [facility] allergy and immunology clinic and at that time they noted that he had endoscopy on 07/29/2009 which showed EOE.  He was started on Flovent 2 puffs swallowed twice daily.  He was feeling better.    Today, the patient presents to clinic accompanied by his mother.  He tells me that he cannot remember a time when he did not have trouble swallowing.  He has to chew all of his food to a "mince" in order to get it down.  His mother vaguely recalls him being on Flovent in the past but does not recall if it helped or not.  Currently he is also having reflux symptoms and takes Nexium 40 mg daily.  Also tells me he takes big gulps of water this also seems like it has a hard time going down.  He is growing tired of his ongoing symptoms.    Denies fever, chills or weight loss.   Past Medical History:  Diagnosis Date   ADHD (attention deficit hyperactivity disorder)    Allergy    Asthma    Wrist fracture     No past surgical history on file.  Current Outpatient Medications  Medication Sig Dispense Refill   esomeprazole (NEXIUM) 40 MG capsule Take 1 capsule (40 mg total) by mouth daily at 12 noon. 30 capsule 12   sertraline (ZOLOFT) 50 MG tablet Take 1 tablet (50 mg total) by mouth daily. 90 tablet 3   No current facility-administered medications for this visit.    Allergies as of 11/10/2022   (No Known Allergies)    No family history on file.  Social History    Socioeconomic History   Marital status: Single    Spouse name: Not on file   Number of children: Not on file   Years of education: Not on file   Highest education level: Not on file  Occupational History   Occupation: Student  Tobacco Use   Smoking status: Never   Smokeless tobacco: Never  Substance and Sexual Activity   Alcohol use: No   Drug use: No   Sexual activity: Never  Other Topics Concern   Not on file  Social History Narrative   Student at [employer], Lobbyist.    Social Determinants of Health   Financial Resource Strain: Not on file  Food Insecurity: Not on file  Transportation Needs: Not on file  Physical Activity: Not on file  Stress: Not on file  Social Connections: Not on file  Intimate Partner Violence: Not on file    Review of Systems:    Constitutional: No weight loss, fever or chills Skin: No rash Cardiovascular: No chest pain Respiratory: No SOB  Gastrointestinal: See HPI and otherwise negative Genitourinary: No dysuria Neurological: No headache, dizziness or syncope Musculoskeletal: No new muscle or joint pain Hematologic: No bleeding Psychiatric: No history of depression or anxiety   Physical Exam:  Vital signs: BP (!) 130/100 (BP Location:  Left Arm, Patient Position: Sitting, Cuff Size: Normal)   Pulse 96   Ht 5\' 10"  (1.778 m) Comment: height measured without shoes  Wt 208 lb 2 oz (94.4 kg)   BMI 29.86 kg/m    Constitutional:   Pleasant Caucasian male appears to be in NAD, Well developed, Well nourished, alert and cooperative Head:  Normocephalic and atraumatic. Eyes:   PEERL, EOMI. No icterus. Conjunctiva pink. Ears:  Normal auditory acuity. Neck:  Supple Throat: Oral cavity and pharynx without inflammation, swelling or lesion.  Respiratory: Respirations even and unlabored. Lungs clear to auscultation bilaterally.   No wheezes, crackles, or rhonchi.  Cardiovascular: Normal S1, S2. No MRG. Regular rate and rhythm. No peripheral  edema, cyanosis or pallor.  Gastrointestinal:  Soft, nondistended, nontender. No rebound or guarding. Normal bowel sounds. No appreciable masses or hepatomegaly. Rectal:  Not performed.  Msk:  Symmetrical without gross deformities. Without edema, no deformity or joint abnormality.  Neurologic:  Alert and  oriented x4;  grossly normal neurologically.  Skin:   Dry and intact without significant lesions or rashes. Psychiatric:  Demonstrates good judgement and reason without abnormal affect or behaviors.  No recent labs or imaging.  Assessment: 1.  Dysphagia: Likely related to history of EOE but also has reflux +/- stricture 2.  GERD: See above, symptoms typically controlled on Nexium 40 daily 3.  History of EOE: Diagnosed when patient was 17 via EGD, I cannot see biopsy results, was started on Flovent which seemingly helped for a while per notes at that time, now patient has continued with symptoms over the past 10 years and has not been on Flovent for some time  Plan: 1.  Discussed with patient that likely his dysphagia continues to be a result of his EOE, but has been years since his last endoscopy and he also now complains of a reflux history that is chronic for him.  Recommend we repeat EGD with possible dilation in the [facility].  His mother sees Dr. Barron Alvine so we will try to keep him seeing the family.  Did provide the patient a detailed list of risks for the procedure and he agrees to proceed. 2.  Likely would benefit from Budesonide suspension in the future 2 mg twice daily x 12 weeks taken slowly over 5 to 10 minutes and not eating or drinking for 30 minutes after, but will await results from EGD which we are trying to schedule soon. 3.  Continue Nexium 40 mg daily for now 4.  Patient to follow in clinic per recommendations from Dr. Barron Alvine after time of procedure.  Hyacinth Meeker, PA-C  Gastroenterology 11/10/2022, 8:33 AM  Cc: Ardith Dark, MD

## 2022-11-10 NOTE — Patient Instructions (Signed)
_______________________________________________________  If your blood pressure at your visit was 140/90 or greater, please contact your primary care physician to follow up on this.  If you are age 26 or younger, your body mass index should be between 19-25. Your Body mass index is 29.86 kg/m. If this is out of the aformentioned range listed, please consider follow up with your Primary Care Provider.  ________________________________________________________  The [facility] providers would like to encourage you to use [facility] to communicate with providers for non-urgent requests or questions.  Due to long hold times on the telephone, sending your provider a message by [facility] may be a faster and more efficient way to get a response.  Please allow 48 business hours for a response.  Please remember that this is for non-urgent requests.  _______________________________________________________  Bonita Quin have been scheduled for an endoscopy. Please follow written instructions given to you at your visit today.  If you use inhalers (even only as needed), please bring them with you on the day of your procedure.  If you take any of the following medications, they will need to be adjusted prior to your procedure:   DO NOT TAKE 7 DAYS PRIOR TO TEST- Trulicity (dulaglutide) Ozempic, Wegovy (semaglutide) Mounjaro (tirzepatide) Bydureon Bcise (exanatide extended release)  DO NOT TAKE 1 DAY PRIOR TO YOUR TEST Rybelsus (semaglutide) Adlyxin (lixisenatide) Victoza (liraglutide) Byetta (exanatide) ___________________________________________________________________________   Due to recent changes in healthcare laws, you may see the results of your imaging and laboratory studies on MyChart before your provider has had a chance to review them.  We understand that in some cases there may be results that are confusing or concerning to you. Not all laboratory results come back in the same time frame and the  provider may be waiting for multiple results in order to interpret others.  Please give [place] 48 hours in order for your provider to thoroughly review all the results before contacting the office for clarification of your results.   Thank you for entrusting me with your care and choosing [facility].  Hyacinth Meeker, PA-C

## 2022-11-14 DIAGNOSIS — K219 Gastro-esophageal reflux disease without esophagitis: Secondary | ICD-10-CM | POA: Diagnosis not present

## 2022-11-14 DIAGNOSIS — K200 Eosinophilic esophagitis: Secondary | ICD-10-CM | POA: Diagnosis not present

## 2022-11-14 DIAGNOSIS — J45909 Unspecified asthma, uncomplicated: Secondary | ICD-10-CM | POA: Diagnosis not present

## 2022-11-14 DIAGNOSIS — R1310 Dysphagia, unspecified: Secondary | ICD-10-CM | POA: Diagnosis not present

## 2022-11-15 ENCOUNTER — Ambulatory Visit: Payer: Medicaid Other | Admitting: Gastroenterology

## 2022-11-15 ENCOUNTER — Other Ambulatory Visit (HOSPITAL_BASED_OUTPATIENT_CLINIC_OR_DEPARTMENT_OTHER): Payer: Self-pay

## 2022-11-15 ENCOUNTER — Encounter: Payer: Self-pay | Admitting: Gastroenterology

## 2022-11-15 VITALS — BP 113/74 | HR 85 | Temp 98.9°F | Resp 17 | Ht 70.0 in | Wt 208.0 lb

## 2022-11-15 DIAGNOSIS — R1310 Dysphagia, unspecified: Secondary | ICD-10-CM | POA: Diagnosis not present

## 2022-11-15 DIAGNOSIS — K200 Eosinophilic esophagitis: Secondary | ICD-10-CM | POA: Diagnosis not present

## 2022-11-15 DIAGNOSIS — K219 Gastro-esophageal reflux disease without esophagitis: Secondary | ICD-10-CM

## 2022-11-15 DIAGNOSIS — R1319 Other dysphagia: Secondary | ICD-10-CM

## 2022-11-15 MED ORDER — SODIUM CHLORIDE 0.9 % IV SOLN: 500.0000 mL | Freq: Once | INTRAVENOUS | Status: DC

## 2022-11-15 MED ORDER — ESOMEPRAZOLE MAGNESIUM 40 MG PO CPDR
40.0000 mg | DELAYED_RELEASE_CAPSULE | Freq: Two times a day (BID) | ORAL | 2 refills | Status: DC
  Filled 2022-11-15: qty 60, 30d supply, fill #0
  Filled 2023-01-15: qty 60, 30d supply, fill #1
  Filled 2023-03-18: qty 60, 30d supply, fill #2

## 2022-11-15 NOTE — Patient Instructions (Addendum)
YOU HAD AN ENDOSCOPIC PROCEDURE TODAY AT THE [facility]:   Refer to the procedure report that was given to you for any specific questions about what was found during the examination.  If the procedure report does not answer your questions, please call your gastroenterologist to clarify.  If you requested that your care partner not be given the details of your procedure findings, then the procedure report has been included in a sealed envelope for you to review at your convenience later.  YOU SHOULD EXPECT: Some feelings of bloating in the abdomen. Passage of more gas than usual.  Walking can help get rid of the air that was put into your GI tract during the procedure and reduce the bloating. If you had a lower endoscopy (such as a colonoscopy or flexible sigmoidoscopy) you may notice spotting of blood in your stool or on the toilet paper. If you underwent a bowel prep for your procedure, you may not have a normal bowel movement for a few days.  Please Note:  You might notice some irritation and congestion in your nose or some drainage.  This is from the oxygen used during your procedure.  There is no need for concern and it should clear up in a day or so.  SYMPTOMS TO REPORT IMMEDIATELY:  Following upper endoscopy (EGD)  Vomiting of blood or coffee ground material  New chest pain or pain under the shoulder blades  Painful or persistently difficult swallowing  New shortness of breath  Fever of 100F or higher  Black, tarry-looking stools  For urgent or emergent issues, a gastroenterologist can be reached at any hour by calling [phone]. Do not use MyChart messaging for urgent concerns.    DIET:  Soft diet today, but then you may proceed to your regular diet tomorrow.  Drink plenty of fluids but you should avoid alcoholic beverages for 24 hours.  ACTIVITY:  You should plan to take it easy for the rest of today and you should NOT DRIVE or use heavy machinery until tomorrow  (because of the sedation medicines used during the test).    FOLLOW UP: Our staff will call the number listed on your records the next business day following your procedure.  We will call around 7:15- 8:00 am to check on you and address any questions or concerns that you may have regarding the information given to you following your procedure. If we do not reach you, we will leave a message.     If any biopsies were taken you will be contacted by phone or by letter within the next 1-3 weeks.  Please call us at [phone] if you have not heard about the biopsies in 3 weeks.    SIGNATURES/CONFIDENTIALITY: You and/or your care partner have signed paperwork which will be entered into your electronic medical record.  These signatures attest to the fact that that the information above on your After Visit Summary has been reviewed and is understood.  Full responsibility of the confidentiality of this discharge information lies with you and/or your care-partner.

## 2022-11-15 NOTE — Progress Notes (Signed)
Agree with the assessment and plan as outlined by Hyacinth Meeker, PA-C. ? ?Aleecia Tapia, DO, FACG ? ?

## 2022-11-15 NOTE — Op Note (Signed)
[facility] Patient Name: Mitchell Bowman Procedure Date: 11/15/2022 9:24 AM MRN: [id number] Endoscopist: Doristine Locks , MD, [id number] Age: 27 Referring MD:  Date of Birth: September 06, 1995 Gender: Male Account #: [id number] Procedure:                Upper GI endoscopy Indications:              Dysphagia, Suspected esophageal reflux, Follow-up                            of eosinophilic esophagitis Medicines:                Monitored Anesthesia Care Procedure:                Pre-Anesthesia Assessment:                           - Prior to the procedure, a History and Physical                            was performed, and patient medications and                            allergies were reviewed. The patient's tolerance of                            previous anesthesia was also reviewed. The risks                            and benefits of the procedure and the sedation                            options and risks were discussed with the patient.                            All questions were answered, and informed consent                            was obtained. Prior Anticoagulants: The patient has                            taken no anticoagulant or antiplatelet agents. ASA                            Grade Assessment: II - A patient with mild systemic                            disease. After reviewing the risks and benefits,                            the patient was deemed in satisfactory condition to                            undergo the procedure.  After obtaining informed consent, the endoscope was                            passed under direct vision. Throughout the                            procedure, the patient's blood pressure, pulse, and                            oxygen saturations were monitored continuously. The                            Olympus Scope [id number] was introduced through the                            mouth, and advanced to the  second part of duodenum.                            The upper GI endoscopy was accomplished without                            difficulty. The patient tolerated the procedure                            well. Scope In: Scope Out: Findings:                 Mucosal changes were found in the entire esophagus.                            Esophageal findings were graded using the                            Eosinophilic Esophagitis Endoscopic Reference Score                            (EoE-EREFS) as: Edema Grade 1 Present (decreased                            clarity or absence of vascular markings), Rings                            Grade 1 Mild (single subtle circumferential ridge                            seen on esophageal distension in the distal                            esophagus), Exudates Grade 0 None (no white lesions                            seen), Furrows Grade 1 Mild (vertical lines without  visible depth) and Stricture none (no stricture                            found). A guidewire was placed and the scope was                            withdrawn. Dilation was performed with a Savary                            dilator with mild resistance at 17 mm and 18 mm.                            The dilation site was examined following endoscope                            reinsertion and showed mild mucosal disruption at                            17 cm from the incisors (upper esophagus) and at                            the GE junction. Estimated blood loss was minimal.                            Biopsies were then obtained from the proximal and                            distal esophagus with cold forceps for histology of                            suspected eosinophilic esophagitis. Estimated blood                            loss was minimal.                           The Z-line was regular and was found 40 cm from the                            incisors.                            The entire examined stomach was normal.                           The examined duodenum was normal. Complications:            No immediate complications. Estimated Blood Loss:     Estimated blood loss was minimal. Impression:               - Esophageal mucosal changes consistent with                            eosinophilic esophagitis. Dilated with 17 then 18  mm Savary dilator with appropriate mucosal                            disruption in the upper esophagus and at the GE                            junction, consistent with successful dilation.                            Biopsies were taken with a cold forceps for                            evaluation of eosinophilic esophagitis.                           - Z-line regular, 40 cm from the incisors.                           - Normal stomach.                           - Normal examined duodenum. Recommendation:           - Patient has a contact number available for                            emergencies. The signs and symptoms of potential                            delayed complications were discussed with the                            patient. Return to normal activities tomorrow.                            Written discharge instructions were provided to the                            patient.                           - Soft diet today then slowly advance as tolerated                            tomorrow per post dilation protocol.                           - Continue present medications.                           - Await pathology results.                           - Increase Nexium (esomeprazole) to 40 mg PO BID,  then can consider reducing depending on clinical                            response and repeat endoscopy results.                           - Repeat upper endoscopy in 8 weeks to check                            healing and for retreatment. Doristine Locks, MD 11/15/2022 10:06:24 AM

## 2022-11-15 NOTE — Progress Notes (Signed)
GASTROENTEROLOGY PROCEDURE H&P NOTE   Primary Care Physician: Ardith Dark, MD    Reason for Procedure:  Dysphagia, history of EOE, GERD  Plan:    EGD with dilation and/or biopsy  Patient is appropriate for endoscopic procedure(s) in the ambulatory ([facility]) setting.  The nature of the procedure, as well as the risks, benefits, and alternatives were carefully and thoroughly reviewed with the patient. Ample time for discussion and questions allowed. The patient understood, was satisfied, and agreed to proceed.     HPI: Mitchell Bowman is a 27 y.o. male who presents for EGD for evaluation of dysphagia, history of eosinophilic esophagitis, and reflux symptoms.  Patient was most recently seen in the Gastroenterology Clinic on 11/10/2022 by Hyacinth Meeker.  No interval change in medical history since that appointment. Please refer to that note for full details regarding GI history and clinical presentation.   Past Medical History:  Diagnosis Date   ADHD (attention deficit hyperactivity disorder)    Allergy    Asthma    Eosinophilic esophagitis    GERD (gastroesophageal reflux disease)    Wrist fracture     Past Surgical History:  Procedure Laterality Date   TYMPANOSTOMY TUBE PLACEMENT     WISDOM TOOTH EXTRACTION      Prior to Admission medications   Medication Sig Start Date End Date Taking? Authorizing Provider  esomeprazole (NEXIUM) 40 MG capsule Take 1 capsule (40 mg total) by mouth daily at 12 noon. 09/03/22  Yes Ardith Dark, MD  sertraline (ZOLOFT) 50 MG tablet Take 1 tablet (50 mg total) by mouth daily. 09/02/22  Yes Ardith Dark, MD    Current Outpatient Medications  Medication Sig Dispense Refill   esomeprazole (NEXIUM) 40 MG capsule Take 1 capsule (40 mg total) by mouth daily at 12 noon. 30 capsule 12   sertraline (ZOLOFT) 50 MG tablet Take 1 tablet (50 mg total) by mouth daily. 90 tablet 3   Current Facility-Administered Medications  Medication Dose Route  Frequency Provider Last Rate Last Admin   0.9 %  sodium chloride infusion  500 mL Intravenous Once Darrow Barreiro V, DO        Allergies as of 11/15/2022   (No Known Allergies)    Family History  Problem Relation Age of Onset   Multiple sclerosis Mother    Hyperlipidemia Father    Heart attack Father    Hypothyroidism Maternal Aunt    Colon polyps Maternal Grandmother    Hypothyroidism Maternal Grandmother    Prostate cancer Maternal Grandfather    Colon cancer Maternal Great-grandmother    Rectal cancer Neg Hx    Stomach cancer Neg Hx    Esophageal cancer Neg Hx     Social History   Socioeconomic History   Marital status: Single    Spouse name: Not on file   Number of children: 0   Years of education: Not on file   Highest education level: Not on file  Occupational History   Occupation: Consulting civil engineer  Tobacco Use   Smoking status: Never   Smokeless tobacco: Never  Vaping Use   Vaping status: Never Used  Substance and Sexual Activity   Alcohol use: Yes    Comment: >1 per day   Drug use: No   Sexual activity: Never  Other Topics Concern   Not on file  Social History Narrative   Consulting civil engineer at [employer], Lobbyist.    Social Determinants of Health   Financial Resource Strain: Not on  file  Food Insecurity: Not on file  Transportation Needs: Not on file  Physical Activity: Not on file  Stress: Not on file  Social Connections: Not on file  Intimate Partner Violence: Not on file    Physical Exam: Vital signs in last 24 hours: @BP  132/86   Pulse 89   Temp 98.9 F (37.2 C)   Ht 5\' 10"  (1.778 m)   Wt 208 lb (94.3 kg)   SpO2 96%   BMI 29.84 kg/m  GEN: NAD EYE: Sclerae anicteric ENT: MMM CV: Non-tachycardic Pulm: CTA b/l GI: Soft, NT/ND NEURO:  Alert & Oriented x 3   Doristine Locks, DO [facility]   11/15/2022 9:32 AM

## 2022-11-15 NOTE — Progress Notes (Signed)
To pacu, VSS. Report to [email or web address] 

## 2022-11-15 NOTE — Progress Notes (Signed)
Called to room to assist during endoscopic procedure.  Patient ID and intended procedure confirmed with present staff. Received instructions for my participation in the procedure from the performing physician.  

## 2022-11-15 NOTE — Progress Notes (Signed)
Pt's states no medical or surgical changes since previsit or office visit. 

## 2022-11-16 ENCOUNTER — Telehealth: Payer: Self-pay

## 2022-11-16 NOTE — Telephone Encounter (Signed)
Follow up call to pt, no answer. 

## 2022-11-17 LAB — SURGICAL PATHOLOGY

## 2022-11-23 DIAGNOSIS — Z419 Encounter for procedure for purposes other than remedying health state, unspecified: Secondary | ICD-10-CM | POA: Diagnosis not present

## 2022-12-09 ENCOUNTER — Other Ambulatory Visit: Payer: Self-pay

## 2022-12-24 DIAGNOSIS — Z419 Encounter for procedure for purposes other than remedying health state, unspecified: Secondary | ICD-10-CM | POA: Diagnosis not present

## 2023-01-02 ENCOUNTER — Encounter: Payer: Self-pay | Admitting: Certified Registered Nurse Anesthetist

## 2023-01-07 ENCOUNTER — Ambulatory Visit: Payer: Medicaid Other | Admitting: Gastroenterology

## 2023-01-07 ENCOUNTER — Encounter: Payer: Self-pay | Admitting: Gastroenterology

## 2023-01-07 VITALS — BP 105/58 | HR 77 | Temp 98.3°F | Resp 19 | Ht 70.0 in | Wt 208.0 lb

## 2023-01-07 DIAGNOSIS — K200 Eosinophilic esophagitis: Secondary | ICD-10-CM | POA: Diagnosis not present

## 2023-01-07 DIAGNOSIS — R1310 Dysphagia, unspecified: Secondary | ICD-10-CM | POA: Diagnosis not present

## 2023-01-07 DIAGNOSIS — K222 Esophageal obstruction: Secondary | ICD-10-CM

## 2023-01-07 DIAGNOSIS — R1314 Dysphagia, pharyngoesophageal phase: Secondary | ICD-10-CM

## 2023-01-07 DIAGNOSIS — R1319 Other dysphagia: Secondary | ICD-10-CM

## 2023-01-07 MED ORDER — SODIUM CHLORIDE 0.9 % IV SOLN: 500.0000 mL | Freq: Once | INTRAVENOUS | Status: DC

## 2023-01-07 NOTE — Progress Notes (Signed)
Pt's states no medical or surgical changes since previsit or office visit. 

## 2023-01-07 NOTE — Progress Notes (Signed)
GASTROENTEROLOGY PROCEDURE H&P NOTE   Primary Care Physician: Ardith Dark, MD    Reason for Procedure:   EoE, dysphagia, esophageal stricture  Plan:    EGD  Patient is appropriate for endoscopic procedure(s) in the ambulatory ([facility]) setting.  The nature of the procedure, as well as the risks, benefits, and alternatives were carefully and thoroughly reviewed with the patient. Ample time for discussion and questions allowed. The patient understood, was satisfied, and agreed to proceed.     HPI: Mitchell Bowman is a 27 y.o. male who presents for EGD for re-evaluation. Hx of EoE and dysphagia, with most recent EGD on 11/15/2022 notable for longitudinal furrows and a subtle stricture in the distal esophagus, dilated with 18 mm Savary dilator with appropriate mucosal disruption in the proximal esophagus at 17 cm and at the GEJ. Esophageal biopsies were normal, consistent with remission, and was kept on Nexium 40 mg BID. Presents today for re-evaluation.   Past Medical History:  Diagnosis Date   ADHD (attention deficit hyperactivity disorder)    Allergy    Asthma    Eosinophilic esophagitis    GERD (gastroesophageal reflux disease)    Wrist fracture     Past Surgical History:  Procedure Laterality Date   TYMPANOSTOMY TUBE PLACEMENT     WISDOM TOOTH EXTRACTION      Prior to Admission medications   Medication Sig Start Date End Date Taking? Authorizing Provider  esomeprazole (NEXIUM) 40 MG capsule Take 1 capsule (40 mg total) by mouth 2 (two) times daily before a meal. 11/15/22  Yes Zalman Hull V, DO  sertraline (ZOLOFT) 50 MG tablet Take 1 tablet (50 mg total) by mouth daily. 09/02/22  Yes Ardith Dark, MD    Current Outpatient Medications  Medication Sig Dispense Refill   esomeprazole (NEXIUM) 40 MG capsule Take 1 capsule (40 mg total) by mouth 2 (two) times daily before a meal. 60 capsule 2   sertraline (ZOLOFT) 50 MG tablet Take 1 tablet (50 mg total) by mouth  daily. 90 tablet 3   Current Facility-Administered Medications  Medication Dose Route Frequency Provider Last Rate Last Admin   0.9 %  sodium chloride infusion  500 mL Intravenous Once Samella Lucchetti V, DO        Allergies as of 01/07/2023   (No Known Allergies)    Family History  Problem Relation Age of Onset   Multiple sclerosis Mother    Hyperlipidemia Father    Heart attack Father    Hypothyroidism Maternal Aunt    Colon polyps Maternal Grandmother    Hypothyroidism Maternal Grandmother    Prostate cancer Maternal Grandfather    Colon cancer Maternal Great-grandmother    Rectal cancer Neg Hx    Stomach cancer Neg Hx    Esophageal cancer Neg Hx     Social History   Socioeconomic History   Marital status: Single    Spouse name: Not on file   Number of children: 0   Years of education: Not on file   Highest education level: Not on file  Occupational History   Occupation: Consulting civil engineer  Tobacco Use   Smoking status: Never   Smokeless tobacco: Never  Vaping Use   Vaping status: Never Used  Substance and Sexual Activity   Alcohol use: Yes    Comment: >1 per day   Drug use: No   Sexual activity: Never  Other Topics Concern   Not on file  Social History Narrative   Student at  [employer], Lobbyist.    Social Determinants of Health   Financial Resource Strain: Not on file  Food Insecurity: Not on file  Transportation Needs: Not on file  Physical Activity: Not on file  Stress: Not on file  Social Connections: Not on file  Intimate Partner Violence: Not on file    Physical Exam: Vital signs in last 24 hours: @BP  135/78   Pulse 84   Temp 98.3 F (36.8 C)   Ht 5\' 10"  (1.778 m)   Wt 208 lb (94.3 kg)   SpO2 95%   BMI 29.84 kg/m  GEN: NAD EYE: Sclerae anicteric ENT: MMM CV: Non-tachycardic Pulm: CTA b/l GI: Soft, NT/ND NEURO:  Alert & Oriented x 3   Doristine Locks, DO [facility]   01/07/2023 10:06 AM

## 2023-01-07 NOTE — Op Note (Signed)
[facility] Patient Name: Mitchell Bowman Procedure Date: 01/07/2023 10:02 AM MRN: [id number] Endoscopist: Doristine Locks , MD, [id number] Age: 27 Referring MD:  Date of Birth: 1995-05-14 Gender: Male Account #: [id number] Procedure:                Upper GI endoscopy Indications:              Dysphagia, Follow-up of eosinophilic esophagitis                           26 y.o. male who presents for EGD for                            re-evaluation. Hx of EoE and dysphagia, with most                            recent EGD on 11/15/2022 notable for longitudinal                            furrows and a subtle stricture in the distal                            esophagus, dilated with 18 mm Savary dilator with                            appropriate mucosal disruption in the proximal                            esophagus at 17 cm and at the GEJ. Esophageal                            biopsies were normal, consistent with remission,                            and was kept on Nexium 40 mg BID. Presents today                            for re-evaluation. Reports significant clinical                            improvement with previous esophageal dilation and                            continued high dose PPI. Medicines:                Monitored Anesthesia Care Procedure:                Pre-Anesthesia Assessment:                           - Prior to the procedure, a History and Physical                            was performed, and patient medications and  allergies were reviewed. The patient's tolerance of                            previous anesthesia was also reviewed. The risks                            and benefits of the procedure and the sedation                            options and risks were discussed with the patient.                            All questions were answered, and informed consent                            was obtained. Prior Anticoagulants:  The patient has                            taken no anticoagulant or antiplatelet agents. ASA                            Grade Assessment: II - A patient with mild systemic                            disease. After reviewing the risks and benefits,                            the patient was deemed in satisfactory condition to                            undergo the procedure.                           After obtaining informed consent, the endoscope was                            passed under direct vision. Throughout the                            procedure, the patient's blood pressure, pulse, and                            oxygen saturations were monitored continuously. The                            Olympus scope [id number] was introduced through the                            mouth, and advanced to the second part of duodenum.                            The upper GI endoscopy was accomplished without  difficulty. The patient tolerated the procedure                            well. Scope In: Scope Out: Findings:                 One benign-appearing, intrinsic mild stenosis was                            found 37 cm from the incisors. This stenosis                            measured less than one cm (in length). The stenosis                            was traversed. A TTS dilator was passed through the                            scope. Dilation with an 18-19-20 mm balloon dilator                            was performed to 18 mm. The dilation site was                            examined and showed mild mucosal disruption x2 and                            moderate improvement in luminal narrowing. This was                            biopsied with a cold forceps for further fracturing                            of the ring. Estimated blood loss was minimal.                           Very mild mucosal changes were found in the entire                             esophagus, but overall improved compared with the                            previous study. Esophageal findings were graded                            using the Eosinophilic Esophagitis Endoscopic                            Reference Score (EoE-EREFS) as: Edema Grade 0                            Normal (distinct vascular markings), Rings Grade 0  None (no ridges or rings seen), Exudates Grade 0                            None (no white lesions seen), Furrows Grade 0 None                            (no vertical lines seen) and Stricture present.                            Biopsies were obtained from the proximal and distal                            esophagus with cold forceps for histology of                            suspected eosinophilic esophagitis. Estimated blood                            loss was minimal.                           The Z-line was regular and was found 38 cm from the                            incisors.                           The entire examined stomach was normal.                           The examined duodenum was normal. Complications:            No immediate complications. Estimated Blood Loss:     Estimated blood loss was minimal. Impression:               - Benign-appearing esophageal stenosis. Dilated                            with 18 mm TTS balloon then fractured with forceps.                           - Subtle esophageal mucosal changes. Biopsies were                            taken with a cold forceps for evaluation of                            eosinophilic esophagitis.                           - Z-line regular, 38 cm from the incisors.                           - Normal stomach.                           -  Normal examined duodenum. Recommendation:           - Patient has a contact number available for                            emergencies. The signs and symptoms of potential                            delayed  complications were discussed with the                            patient. Return to normal activities tomorrow.                            Written discharge instructions were provided to the                            patient.                           - Soft foods today, then resume previous diet                            tomorrow as tolerated.                           - Continue present medications.                           - Await pathology results.                           - Repeat upper endoscopy PRN for retreatment.                           - Return to GI clinic in 3 months, or sooner as                            needed.                           - If symptoms still well controlled, can                            potentially reduce Nexium to 40 mg daily. Doristine Locks, MD 01/07/2023 10:40:21 AM

## 2023-01-07 NOTE — Progress Notes (Signed)
Report given to PACU, vss 

## 2023-01-07 NOTE — Progress Notes (Signed)
Called to room to assist during endoscopic procedure.  Patient ID and intended procedure confirmed with present staff. Received instructions for my participation in the procedure from the performing physician.  

## 2023-01-07 NOTE — Progress Notes (Signed)
1014 Robinul 0.1 mg IV given due large amount of secretions upon assessment.  MD made aware, vss

## 2023-01-07 NOTE — Patient Instructions (Signed)
Return to for office visit in 3 months or sooner if needed- (this appointment was made see next page)   If symptoms well controlled ,may reduce Nexium to 40 mg daily  Await pathology results on biopsies done today    DUE TO DILATATION OF ESOPHAGUS TODAY, SOFT FOODS ONLY TODAY ,ADVANCE TO REGULAR DIET TOMORROW AS TOLERATED - HANDOUT GIVEN TO YOU TODAY   YOU HAD AN ENDOSCOPIC PROCEDURE TODAY AT THE [facility]:   Refer to the procedure report that was given to you for any specific questions about what was found during the examination.  If the procedure report does not answer your questions, please call your gastroenterologist to clarify.  If you requested that your care partner not be given the details of your procedure findings, then the procedure report has been included in a sealed envelope for you to review at your convenience later.  YOU SHOULD EXPECT: Some feelings of bloating in the abdomen. Passage of more gas than usual.  Walking can help get rid of the air that was put into your GI tract during the procedure and reduce the bloating. If you had a lower endoscopy (such as a colonoscopy or flexible sigmoidoscopy) you may notice spotting of blood in your stool or on the toilet paper. If you underwent a bowel prep for your procedure, you may not have a normal bowel movement for a few days.  Please Note:  You might notice some irritation and congestion in your nose or some drainage.  This is from the oxygen used during your procedure.  There is no need for concern and it should clear up in a day or so.  SYMPTOMS TO REPORT IMMEDIATELY:   Following upper endoscopy (EGD)  Vomiting of blood or coffee ground material  New chest pain or pain under the shoulder blades  Painful or persistently difficult swallowing  New shortness of breath  Fever of 100F or higher  Black, tarry-looking stools  For urgent or emergent issues, a gastroenterologist can be reached at any hour by  calling [phone]. Do not use MyChart messaging for urgent concerns.    DIET:  We do recommend- SOFT FOOD ONLY TODAY a small meal at first, but then you may proceed to your regular diet TOMORROW  Drink plenty of fluids but you should avoid alcoholic beverages for 24 hours.  ACTIVITY:  You should plan to take it easy for the rest of today and you should NOT DRIVE or use heavy machinery until tomorrow (because of the sedation medicines used during the test).    FOLLOW UP: Our staff will call the number listed on your records the next business day following your procedure.  We will call around 7:15- 8:00 am to check on you and address any questions or concerns that you may have regarding the information given to you following your procedure. If we do not reach you, we will leave a message.     If any biopsies were taken you will be contacted by phone or by letter within the next 1-3 weeks.  Please call us at [phone] if you have not heard about the biopsies in 3 weeks.    SIGNATURES/CONFIDENTIALITY: You and/or your care partner have signed paperwork which will be entered into your electronic medical record.  These signatures attest to the fact that that the information above on your After Visit Summary has been reviewed and is understood.  Full responsibility of the confidentiality of this discharge information  lies with you and/or your care-partner.

## 2023-01-10 ENCOUNTER — Telehealth: Payer: Self-pay

## 2023-01-10 NOTE — Telephone Encounter (Signed)
Follow up call to pt, lm for pt to call if having any difficulty with normal activities or eating and drinking.  Also to call if any other questions or concerns.  

## 2023-01-11 LAB — SURGICAL PATHOLOGY

## 2023-01-15 ENCOUNTER — Other Ambulatory Visit (HOSPITAL_BASED_OUTPATIENT_CLINIC_OR_DEPARTMENT_OTHER): Payer: Self-pay

## 2023-01-23 DIAGNOSIS — Z419 Encounter for procedure for purposes other than remedying health state, unspecified: Secondary | ICD-10-CM | POA: Diagnosis not present

## 2023-02-23 DIAGNOSIS — Z419 Encounter for procedure for purposes other than remedying health state, unspecified: Secondary | ICD-10-CM | POA: Diagnosis not present

## 2023-03-18 ENCOUNTER — Other Ambulatory Visit (HOSPITAL_BASED_OUTPATIENT_CLINIC_OR_DEPARTMENT_OTHER): Payer: Self-pay

## 2023-03-26 DIAGNOSIS — Z419 Encounter for procedure for purposes other than remedying health state, unspecified: Secondary | ICD-10-CM | POA: Diagnosis not present

## 2023-04-22 ENCOUNTER — Other Ambulatory Visit (HOSPITAL_BASED_OUTPATIENT_CLINIC_OR_DEPARTMENT_OTHER): Payer: Self-pay

## 2023-04-22 ENCOUNTER — Encounter: Payer: Self-pay | Admitting: Gastroenterology

## 2023-04-22 ENCOUNTER — Ambulatory Visit: Payer: Medicaid Other | Admitting: Gastroenterology

## 2023-04-22 VITALS — BP 124/72 | HR 82 | Ht 70.0 in | Wt 213.0 lb

## 2023-04-22 DIAGNOSIS — K200 Eosinophilic esophagitis: Secondary | ICD-10-CM

## 2023-04-22 DIAGNOSIS — K222 Esophageal obstruction: Secondary | ICD-10-CM

## 2023-04-22 MED ORDER — ESOMEPRAZOLE MAGNESIUM 40 MG PO CPDR
40.0000 mg | DELAYED_RELEASE_CAPSULE | Freq: Every day | ORAL | 3 refills | Status: AC
  Filled 2023-04-22 – 2023-06-21 (×3): qty 90, 90d supply, fill #0
  Filled 2023-09-30: qty 90, 90d supply, fill #1
  Filled 2024-01-04: qty 90, 90d supply, fill #2

## 2023-04-22 NOTE — Progress Notes (Signed)
 Chief Complaint:    Eosinophilic Esophagitis  GI History: 28 year old male with a history of Eosinophilic Esophagitis. - 07/29/2009: EGD at [facility] in [place] done for episodes of food impaction in the esophagus. At that time endoscopic diagnosis of probable eosinophilic esophagitis. - 11/21/2009: Follow-up with [facility] with diagnosis of EOE and started on Flovent 2 puffs bid - 11/10/2022: Initial appointment with [facility] with c/o dysphagia.  Was no longer taking Flovent for several years.  Did report history of reflux symptoms and was taking Nexium 40 mg daily. - 11/15/2022: EGD: longitudinal furrows and a subtle stricture in the distal esophagus, dilated with 18 mm Savary dilator with appropriate mucosal disruption in the proximal esophagus at 17 cm and at the GEJ. Esophageal biopsies were normal, consistent with remission, and was kept on Nexium 40 mg BID. - 01/07/2023: EGD: Benign stenosis at 37 cm dilated with 18 mm TTS balloon with appropriate mucosal disruption x 2 then fractured with forceps.  Esophageal biopsies without elevated eosinophils.  Normal stomach and duodenum  HPI:     Patient is a 28 y.o. male presenting to the Gastroenterology Clinic for follow-up.  States his symptoms have improved since EGD with dilation x 2 and increasing Nexium to 40 mg twice daily.  Has since reduced Nexium to 40 mg daily and no breakthrough sxs. Tolerated all PO intake without any dysphagia now.  Needs refill of Nexium.   Review of systems:     No chest pain, no SOB, no fevers, no urinary sx   Past Medical History:  Diagnosis Date   ADHD (attention deficit hyperactivity disorder)    Allergy    Asthma    Eosinophilic esophagitis    GERD (gastroesophageal reflux disease)    Wrist fracture     Patient's surgical history, family medical history, social history, medications and allergies were all reviewed in Epic    Current Outpatient  Medications  Medication Sig Dispense Refill   esomeprazole (NEXIUM) 40 MG capsule Take 40 mg by mouth at bedtime.     sertraline (ZOLOFT) 50 MG tablet Take 1 tablet (50 mg total) by mouth daily. 90 tablet 3   No current facility-administered medications for this visit.    Physical Exam:     BP 124/72   Pulse 82   Ht 5\' 10"  (1.778 m)   Wt 213 lb (96.6 kg)   BMI 30.56 kg/m   GENERAL:  Pleasant male in NAD PSYCH: : Cooperative, normal affect EENT:  conjunctiva pink, mucous membranes moist, neck supple without masses CARDIAC:  RRR, no murmur heard, no peripheral edema PULM: Normal respiratory effort, lungs CTA bilaterally, no wheezing ABDOMEN:  Nondistended, soft, nontender. No obvious masses, no hepatomegaly,  normal bowel sounds SKIN:  turgor, no lesions seen Musculoskeletal:  Normal muscle tone, normal strength NEURO: Alert and oriented x 3, no focal neurologic deficits   IMPRESSION and PLAN:    1) Eosinophilic Esophagitis 2) Esophageal stricture 28 year old male with longstanding history of EOE.  Excellent clinical response to esophageal dilation x 2 and high-dose PPI.  Has since weaned Nexium to 40 mg daily and still no breakthrough symptoms.  Discussed pathophysiology and medical management of EOE today with plan for following:  - Resume Nexium 40 mg daily for now. If still well controlled at 6-12 months, can consider reducing to 20 mg daily - Refill placed for Nexium 40 mg daily, #90, RF 5 - Check BMP and micronutrient panel at 1 year -  RTC in 1 year or sooner prn           Shellia Cleverly ,DO, FACG 04/22/2023, 9:17 AM

## 2023-04-22 NOTE — Patient Instructions (Addendum)
 _______________________________________________________  If your blood pressure at your visit was 140/90 or greater, please contact your primary care physician to follow up on this.  If you are age 27 or younger, your body mass index should be between 19-25. Your Body mass index is 30.56 kg/m. If this is out of the aformentioned range listed, please consider follow up with your Primary Care Provider.  ________________________________________________________  The [facility] providers would like to encourage you to use [facility] to communicate with providers for non-urgent requests or questions.  Due to long hold times on the telephone, sending your provider a message by [facility] may be a faster and more efficient way to get a response.  Please allow 48 business hours for a response.  Please remember that this is for non-urgent requests.  _______________________________________________________  We have sent the following medications to your pharmacy for you to pick up at your convenience:  Nexium  You will need a follow up in 1 year.  We will contact you to schedule this appointment.  It was a pleasure to see you today!  Vito Cirigliano, D.O.

## 2023-04-23 DIAGNOSIS — Z419 Encounter for procedure for purposes other than remedying health state, unspecified: Secondary | ICD-10-CM | POA: Diagnosis not present

## 2023-05-02 ENCOUNTER — Other Ambulatory Visit (HOSPITAL_BASED_OUTPATIENT_CLINIC_OR_DEPARTMENT_OTHER): Payer: Self-pay

## 2023-05-13 ENCOUNTER — Other Ambulatory Visit: Payer: Self-pay

## 2023-05-13 ENCOUNTER — Encounter (HOSPITAL_COMMUNITY): Payer: Self-pay

## 2023-05-13 ENCOUNTER — Emergency Department (HOSPITAL_COMMUNITY)

## 2023-05-13 ENCOUNTER — Emergency Department (HOSPITAL_COMMUNITY)
Admission: EM | Admit: 2023-05-13 | Discharge: 2023-05-13 | Disposition: A | Discharge status: Home / Self Care | Attending: Emergency Medicine | Admitting: Emergency Medicine

## 2023-05-13 DIAGNOSIS — S41151A Open bite of right upper arm, initial encounter: Secondary | ICD-10-CM | POA: Diagnosis present

## 2023-05-13 DIAGNOSIS — M799 Soft tissue disorder, unspecified: Secondary | ICD-10-CM | POA: Diagnosis not present

## 2023-05-13 DIAGNOSIS — S91351A Open bite, right foot, initial encounter: Secondary | ICD-10-CM | POA: Insufficient documentation

## 2023-05-13 DIAGNOSIS — W5581XA Bitten by other mammals, initial encounter: Secondary | ICD-10-CM | POA: Diagnosis not present

## 2023-05-13 DIAGNOSIS — J45909 Unspecified asthma, uncomplicated: Secondary | ICD-10-CM | POA: Diagnosis not present

## 2023-05-13 DIAGNOSIS — W540XXA Bitten by dog, initial encounter: Secondary | ICD-10-CM | POA: Insufficient documentation

## 2023-05-13 DIAGNOSIS — Z23 Encounter for immunization: Secondary | ICD-10-CM | POA: Insufficient documentation

## 2023-05-13 DIAGNOSIS — S51851A Open bite of right forearm, initial encounter: Secondary | ICD-10-CM | POA: Insufficient documentation

## 2023-05-13 DIAGNOSIS — I10 Essential (primary) hypertension: Secondary | ICD-10-CM | POA: Diagnosis not present

## 2023-05-13 DIAGNOSIS — R58 Hemorrhage, not elsewhere classified: Secondary | ICD-10-CM | POA: Diagnosis not present

## 2023-05-13 DIAGNOSIS — S61051A Open bite of right thumb without damage to nail, initial encounter: Secondary | ICD-10-CM | POA: Insufficient documentation

## 2023-05-13 MED ORDER — IBUPROFEN 200 MG PO TABS
600.0000 mg | ORAL_TABLET | Freq: Once | ORAL | Status: AC
  Administered 2023-05-13: 600 mg via ORAL
  Filled 2023-05-13: qty 1

## 2023-05-13 MED ORDER — HYDROCODONE-ACETAMINOPHEN 5-325 MG PO TABS
1.0000 | ORAL_TABLET | Freq: Once | ORAL | Status: AC
Start: 2023-05-13 — End: 2023-05-13
  Administered 2023-05-13: 1 via ORAL
  Filled 2023-05-13: qty 1

## 2023-05-13 MED ORDER — TETANUS-DIPHTH-ACELL PERTUSSIS 5-2.5-18.5 LF-MCG/0.5 IM SUSY
0.5000 mL | PREFILLED_SYRINGE | Freq: Once | INTRAMUSCULAR | Status: AC
  Administered 2023-05-13: 0.5 mL via INTRAMUSCULAR
  Filled 2023-05-13: qty 0.5

## 2023-05-13 MED ORDER — AMOXICILLIN-POT CLAVULANATE 875-125 MG PO TABS
1.0000 | ORAL_TABLET | Freq: Two times a day (BID) | ORAL | 0 refills | Status: AC
Start: 2023-05-13 — End: 2023-05-20

## 2023-05-13 NOTE — ED Provider Notes (Signed)
 [place] EMERGENCY DEPARTMENT AT [facility] Provider Note   CSN: [id number] Arrival date & time: 05/13/23  1033     History  Chief Complaint  Patient presents with   Animal Bite    Mitchell Bowman is a 28 y.o. male.  HPI     28 year old male with a history of asthma, allergies, ADHD, eosinophilic esophagitis, presents with concern for dog bites.  Presents with concern for bug bites to the right arm and right foot while trying to break up 2 dogs who were fighting.  The dogs are his. They are not UTD on their rabies vaccines--one was taken into custody with animal control and the other will remain at his home.  They have not been behaving unusually.  Not sure how wound on bottom of the foot occurred.  Can move all fingers, no numbness.   Past Medical History:  Diagnosis Date   ADHD (attention deficit hyperactivity disorder)    Allergy    Asthma    Eosinophilic esophagitis    GERD (gastroesophageal reflux disease)    Wrist fracture      Home Medications Prior to Admission medications   Medication Sig Start Date End Date Taking? Authorizing Provider  amoxicillin-clavulanate (AUGMENTIN) 875-125 MG tablet Take 1 tablet by mouth every 12 (twelve) hours for 7 days. 05/13/23 05/20/23 Yes Alvira Monday, MD  esomeprazole (NEXIUM) 40 MG capsule Take 1 capsule (40 mg total) by mouth at bedtime. 04/22/23   Cirigliano, Vito V, DO  sertraline (ZOLOFT) 50 MG tablet Take 1 tablet (50 mg total) by mouth daily. 09/02/22   Ardith Dark, MD      Allergies    Patient has no known allergies.    Review of Systems   Review of Systems  Physical Exam Updated Vital Signs BP (!) 132/92   Pulse 85   Temp 98.5 F (36.9 C) (Oral)   Resp 18   Ht 5\' 10"  (1.778 m)   Wt 96.6 kg   SpO2 96%   BMI 30.56 kg/m  Physical Exam Vitals and nursing note reviewed.  Constitutional:      General: He is not in acute distress.    Appearance: Normal appearance. He is not ill-appearing,  toxic-appearing or diaphoretic.  HENT:     Head: Normocephalic.  Eyes:     Conjunctiva/sclera: Conjunctivae normal.  Cardiovascular:     Rate and Rhythm: Normal rate and regular rhythm.     Pulses: Normal pulses.  Pulmonary:     Effort: Pulmonary effort is normal. No respiratory distress.  Musculoskeletal:        General: No deformity or signs of injury.     Cervical back: No rigidity.     Comments: Normal oppens, finger abduction, wrist extension and flexion, finger extension and flexion, normal pulses, normal cap refill, normal sensation  Skin:    General: Skin is warm and dry.     Coloration: Skin is not jaundiced or pale.     Comments: See photo. Puncture wounds 5mm to volar forearm, thumb with more superficial wound, plantar surface foot 2cm more superficial wound  Neurological:     General: No focal deficit present.     Mental Status: He is alert and oriented to person, place, and time.          ED Results / Procedures / Treatments   Labs (all labs ordered are listed, but only abnormal results are displayed) Labs Reviewed - No data to display  EKG None  Radiology No results found.   Procedures Procedures    Medications Ordered in ED Medications  Tdap (BOOSTRIX) injection 0.5 mL (0.5 mLs Intramuscular Given 05/13/23 1101)  HYDROcodone-acetaminophen (NORCO/VICODIN) 5-325 MG per tablet 1 tablet (1 tablet Oral Given 05/13/23 1101)  ibuprofen (ADVIL) tablet 600 mg (600 mg Oral Given 05/13/23 1101)    ED Course/ Medical Decision Making/ A&P                                  28 year old male with a history of asthma, allergies, ADHD, eosinophilic esophagitis, presents with concern for dog bites.  X-ray completed and personally read by me and radiology shows mild soft tissue thickening and soft tissue emphysema along the radial aspect of the forearm, no acute fracture or dislocation in the foot or wrist.  No sign of tendon injury or significant vascular injury on  exam.  Discussed plan for irrigation/wound care with nursing.  TDap given .  Dog under his care and other dog observed under animal control with low rabies risk at this time.  Given rx for augmentin, discussed reasons to return. Patient discharged in stable condition with understanding of reasons to return.         Final Clinical Impression(s) / ED Diagnoses Final diagnoses:  Dog bite, initial encounter    Rx / DC Orders ED Discharge Orders          Ordered    amoxicillin-clavulanate (AUGMENTIN) 875-125 MG tablet  Every 12 hours        05/13/23 1535              Alvira Monday, MD 05/15/23 1259

## 2023-05-13 NOTE — ED Triage Notes (Signed)
 Pt arrived via [facility]. Pt was breaking up two of his dogs fighting and he got bit in the right forearm. Pt has one puncture wound on posterior of right forearm. Bleeding is controlled. Per EMS, pt has a cut on the posterior of right foot. Pt has approx 0.5cm lac on posterior of right foot. Bleeding is controlled.

## 2023-05-13 NOTE — ED Provider Triage Note (Signed)
 Emergency Medicine Provider Triage Evaluation Note  Mitchell Bowman , a 28 y.o. male  was evaluated in triage.  Pt complains of dog bite. Was breaking up his dogs who were fighting and got bit on right wrist/forearm and somehow injured right foot (toe is hurting, small plantar foot lac).  Review of Systems  Positive: wounds Negative: numbness  Physical Exam  BP 90/69   Pulse 69   Temp 98.3 F (36.8 C) (Oral)   Resp 19   Ht 5\' 10"  (1.778 m)   Wt 96.6 kg   SpO2 99%   BMI 30.56 kg/m  Gen:   Awake, no distress Resp:  Normal effort MSK:   Laceration to dorsal right wrist. Normal but painful ROM. Small wound to distal plantar right foot Other:  2+ radial and DP pulses on right  Medical Decision Making  Medically screening exam initiated at 10:52 AM.  Appropriate orders placed.  Mitchell Bowman was informed that the remainder of the evaluation will be completed by another provider, this initial triage assessment does not replace that evaluation, and the importance of remaining in the ED until their evaluation is complete.  Tdap updated. Oral pain control and xrays.  Patient states he does not think one of his dog's shots are up-to-date, but animal control was involved and he prefers to defer rabies immunization/vaccination at this time.   Mitchell Loveless, MD 05/13/23 [id number]

## 2023-05-13 NOTE — ED Notes (Signed)
 Pt wound to right anterior and posterior forearm and right thumb cleaned with wound cleanser and wrapped. PT right foot cleaned and wrapped as well. DC instructions reviewed with patient and mom and he understands that he needs to begin his antibiotics right away. Made patient aware of signs of infection to be on the look out for. Pt voices understanding.

## 2023-06-04 DIAGNOSIS — Z419 Encounter for procedure for purposes other than remedying health state, unspecified: Secondary | ICD-10-CM | POA: Diagnosis not present

## 2023-06-13 ENCOUNTER — Other Ambulatory Visit: Payer: Self-pay

## 2023-06-13 ENCOUNTER — Other Ambulatory Visit (HOSPITAL_COMMUNITY): Payer: Self-pay

## 2023-06-16 ENCOUNTER — Other Ambulatory Visit: Payer: Self-pay

## 2023-06-22 ENCOUNTER — Other Ambulatory Visit (HOSPITAL_BASED_OUTPATIENT_CLINIC_OR_DEPARTMENT_OTHER): Payer: Self-pay

## 2023-07-04 DIAGNOSIS — Z419 Encounter for procedure for purposes other than remedying health state, unspecified: Secondary | ICD-10-CM | POA: Diagnosis not present

## 2023-08-04 DIAGNOSIS — Z419 Encounter for procedure for purposes other than remedying health state, unspecified: Secondary | ICD-10-CM | POA: Diagnosis not present

## 2023-09-03 DIAGNOSIS — Z419 Encounter for procedure for purposes other than remedying health state, unspecified: Secondary | ICD-10-CM | POA: Diagnosis not present

## 2023-09-30 ENCOUNTER — Other Ambulatory Visit (HOSPITAL_BASED_OUTPATIENT_CLINIC_OR_DEPARTMENT_OTHER): Payer: Self-pay

## 2023-09-30 ENCOUNTER — Other Ambulatory Visit: Payer: Self-pay | Admitting: Family Medicine

## 2023-10-03 ENCOUNTER — Other Ambulatory Visit (HOSPITAL_BASED_OUTPATIENT_CLINIC_OR_DEPARTMENT_OTHER): Payer: Self-pay

## 2023-10-03 MED ORDER — SERTRALINE HCL 50 MG PO TABS
50.0000 mg | ORAL_TABLET | Freq: Every day | ORAL | 0 refills | Status: DC
  Filled 2023-10-03: qty 30, 30d supply, fill #0

## 2023-10-04 DIAGNOSIS — Z419 Encounter for procedure for purposes other than remedying health state, unspecified: Secondary | ICD-10-CM | POA: Diagnosis not present

## 2023-11-04 DIAGNOSIS — Z419 Encounter for procedure for purposes other than remedying health state, unspecified: Secondary | ICD-10-CM | POA: Diagnosis not present

## 2023-12-11 ENCOUNTER — Other Ambulatory Visit: Payer: Self-pay | Admitting: Family Medicine

## 2023-12-12 ENCOUNTER — Other Ambulatory Visit: Payer: Self-pay | Admitting: Family Medicine

## 2023-12-22 ENCOUNTER — Other Ambulatory Visit (HOSPITAL_BASED_OUTPATIENT_CLINIC_OR_DEPARTMENT_OTHER): Payer: Self-pay

## 2023-12-22 ENCOUNTER — Encounter (HOSPITAL_BASED_OUTPATIENT_CLINIC_OR_DEPARTMENT_OTHER): Payer: Self-pay

## 2023-12-23 ENCOUNTER — Encounter (HOSPITAL_BASED_OUTPATIENT_CLINIC_OR_DEPARTMENT_OTHER): Payer: Self-pay

## 2023-12-23 ENCOUNTER — Other Ambulatory Visit: Payer: Self-pay

## 2023-12-23 ENCOUNTER — Emergency Department (HOSPITAL_BASED_OUTPATIENT_CLINIC_OR_DEPARTMENT_OTHER)
Admission: EM | Admit: 2023-12-23 | Discharge: 2023-12-23 | Disposition: A | Discharge status: Home / Self Care | Attending: Emergency Medicine | Admitting: Emergency Medicine

## 2023-12-23 DIAGNOSIS — E860 Dehydration: Secondary | ICD-10-CM | POA: Insufficient documentation

## 2023-12-23 DIAGNOSIS — R1084 Generalized abdominal pain: Secondary | ICD-10-CM | POA: Insufficient documentation

## 2023-12-23 DIAGNOSIS — J45909 Unspecified asthma, uncomplicated: Secondary | ICD-10-CM | POA: Diagnosis not present

## 2023-12-23 HISTORY — DX: Anxiety disorder, unspecified: F41.9

## 2023-12-23 LAB — COMPREHENSIVE METABOLIC PANEL WITH GFR
ALT: 72 U/L — ABNORMAL HIGH (ref 0–44)
AST: 27 U/L (ref 15–41)
Albumin: 4.8 g/dL (ref 3.5–5.0)
Alkaline Phosphatase: 69 U/L (ref 38–126)
Anion gap: 13 (ref 5–15)
BUN: 12 mg/dL (ref 6–20)
CO2: 24 mmol/L (ref 22–32)
Calcium: 9.7 mg/dL (ref 8.9–10.3)
Chloride: 102 mmol/L (ref 98–111)
Creatinine, Ser: 0.8 mg/dL (ref 0.61–1.24)
GFR, Estimated: >60 mL/min (ref >60)
Glucose, Bld: 131 mg/dL — ABNORMAL HIGH (ref 70–99)
Potassium: 3.5 mmol/L (ref 3.5–5.1)
Sodium: 139 mmol/L (ref 135–145)
Total Bilirubin: 0.8 mg/dL (ref 0.0–1.2)
Total Protein: 7.5 g/dL (ref 6.5–8.1)

## 2023-12-23 LAB — CBC
HCT: 42.8 % (ref 39.0–52.0)
Hemoglobin: 15.4 g/dL (ref 13.0–17.0)
MCH: 29.1 pg (ref 26.0–34.0)
MCHC: 36 g/dL (ref 30.0–36.0)
MCV: 80.9 fL (ref 80.0–100.0)
Platelets: 231 K/uL (ref 150–400)
RBC: 5.29 MIL/uL (ref 4.22–5.81)
RDW: 11.2 % — ABNORMAL LOW (ref 11.5–15.5)
WBC: 10 K/uL (ref 4.0–10.5)
nRBC: 0 % (ref 0.0–0.2)

## 2023-12-23 LAB — URINALYSIS, ROUTINE W REFLEX MICROSCOPIC
Bilirubin Urine: NEGATIVE
Glucose, UA: NEGATIVE mg/dL
Hgb urine dipstick: NEGATIVE
Ketones, ur: 15 mg/dL — AB
Leukocytes,Ua: NEGATIVE
Nitrite: NEGATIVE
Specific Gravity, Urine: 1.033 — ABNORMAL HIGH (ref 1.005–1.030)
pH: 6 (ref 5.0–8.0)

## 2023-12-23 LAB — LIPASE, BLOOD: Lipase: 17 U/L (ref 11–51)

## 2023-12-23 MED ORDER — ONDANSETRON HCL 4 MG/2ML IJ SOLN
4.0000 mg | Freq: Once | INTRAMUSCULAR | Status: AC
  Administered 2023-12-23: 4 mg via INTRAVENOUS
  Filled 2023-12-23: qty 2

## 2023-12-23 MED ORDER — SODIUM CHLORIDE 0.9 % IV BOLUS
1000.0000 mL | Freq: Once | INTRAVENOUS | Status: AC
  Administered 2023-12-23: 1000 mL via INTRAVENOUS

## 2023-12-23 MED ORDER — FENTANYL CITRATE (PF) 50 MCG/ML IJ SOSY
50.0000 ug | PREFILLED_SYRINGE | Freq: Once | INTRAMUSCULAR | Status: AC
  Administered 2023-12-23: 50 ug via INTRAVENOUS
  Filled 2023-12-23: qty 1

## 2023-12-23 NOTE — ED Notes (Signed)
Patient ambulated to the bathroom for a urine sample.  

## 2023-12-23 NOTE — ED Triage Notes (Signed)
 Patient reports abdominal pain in the center of the abdomen and vomiting x1 today before coming to the ER. Reports a bowel movement a couple of days ago. Started a free sample of ozempic 2 weeks ago (2 doses). Patient took some of his mom's medication with no relief ( senakot and percocet). Rates abdominal pain 7/10.

## 2023-12-23 NOTE — ED Provider Notes (Signed)
 [name] Provider Note   CSN: [id number] Arrival date & time: 12/23/23  0206     Patient presents with: Abdominal Pain   ASAEL Bowman is a 28 y.o. male.   The history is provided by the patient and a parent.  Patient with history of ADHD, anxiety, asthma, eosinophilic esophagitis presents with his mother for abdominal pain. Patient reports onset of abdominal pain over the past day that was sudden and occurred at rest.  Reports an episode of nonbloody emesis prior to arrival.  No chest pain or shortness of breath.  Patient did take medications for his pain without relief prior to arrival.  Patient also started Ozempic recently and increased the dosing recently. No previous abdominal surgeries No Bowel movement in the past 24-hour   Past Medical History:  Diagnosis Date   ADHD (attention deficit hyperactivity disorder)    Allergy    Anxiety    Asthma    Eosinophilic esophagitis    GERD (gastroesophageal reflux disease)    Wrist fracture      Prior to Admission medications   Medication Sig Start Date End Date Taking? Authorizing Provider  esomeprazole  (NEXIUM ) 40 MG capsule Take 1 capsule (40 mg total) by mouth at bedtime. 04/22/23   Cirigliano, Vito V, DO  sertraline  (ZOLOFT ) 50 MG tablet Take 1 tablet (50 mg total) by mouth daily. 10/03/23   Kennyth Worth HERO, MD    Allergies: Patient has no known allergies.    Review of Systems  Constitutional:  Negative for fever.  Cardiovascular:  Negative for chest pain.  Gastrointestinal:  Positive for abdominal pain, nausea and vomiting. Negative for diarrhea.  Genitourinary:  Negative for dysuria and testicular pain.    Updated Vital Signs BP 117/78   Pulse 74   Temp 98.1 F (36.7 C) (Oral)   Resp 15   Ht 1.753 m (5' 9)   Wt 88.5 kg   SpO2 95%   BMI 28.80 kg/m   Physical Exam CONSTITUTIONAL: Well developed/well nourished HEAD: Normocephalic/atraumatic EYES: EOMI/PERRL,  no icterus ENMT: Mucous membranes moist NECK: supple no meningeal signs CV: S1/S2 noted, no murmurs/rubs/gallops noted LUNGS: Lungs are clear to auscultation bilaterally, no apparent distress ABDOMEN: soft, nontender, no rebound or guarding, bowel sounds noted throughout abdomen GU:no cva tenderness NEURO: Pt is awake/alert/appropriate, moves all extremitiesx4.  No facial droop.   EXTREMITIES: pulses normal/equal, full ROM SKIN: warm, color normal PSYCH: no abnormalities of mood noted, alert and oriented to situation  (all labs ordered are listed, but only abnormal results are displayed) Labs Reviewed  COMPREHENSIVE METABOLIC PANEL WITH GFR - Abnormal; Notable for the following components:      Result Value   Glucose, Bld 131 (*)    ALT 72 (*)    All other components within normal limits  CBC - Abnormal; Notable for the following components:   RDW 11.2 (*)    All other components within normal limits  URINALYSIS, ROUTINE W REFLEX MICROSCOPIC - Abnormal; Notable for the following components:   Specific Gravity, Urine 1.033 (*)    Ketones, ur 15 (*)    Protein, ur TRACE (*)    All other components within normal limits  LIPASE, BLOOD    EKG: None  Radiology: No results found.   Procedures   Medications Ordered in the ED  fentaNYL (SUBLIMAZE) injection 50 mcg (50 mcg Intravenous Given 12/23/23 0251)  ondansetron  (ZOFRAN ) injection 4 mg (4 mg Intravenous Given 12/23/23 0251)  sodium  chloride 0.9 % bolus 1,000 mL (0 mLs Intravenous Stopped 12/23/23 0413)    Clinical Course as of 12/23/23 0538  Fri Dec 23, 2023  0335 Ketones, ur(!): 15 Ketonuria [DW]  [phone] Overall patient is improving, will trial p.o. fluid [DW]  0423 Patient continues to improve, no focal tenderness. Patient would prefer to be monitored for another hour to ensure no return of pain.  [DW]  [phone] Patient has been monitored for several hours.  He has had no return of pain, and no vomiting Labs are overall  reassuring   [DW]  0538 We discussed his recent use of Ozempic which may be playing a role in his symptoms.  Patient feels comfortable with discharge home.  No indication for imaging at this time [DW]  0538 Patient is appropriate for d/c home.  I doubt acute abdominal emergency at this time.  We discussed strict ER return precautions including abdominal pain that migrates to RLQ, fever >100.10F with repetitive vomiting over next 8-12 hours [DW]    Clinical Course User Index [DW] Midge Golas, MD                                 Medical Decision Making Amount and/or Complexity of Data Reviewed Labs: ordered. Decision-making details documented in ED Course.  Risk Prescription drug management.   This patient presents to the ED for concern of abdominal pain, this involves an extensive number of treatment options, and is a complaint that carries with it a high risk of complications and morbidity.  The differential diagnosis includes but is not limited to cholecystitis, cholelithiasis, pancreatitis, gastritis, peptic ulcer disease, appendicitis, bowel obstruction, bowel perforation, diverticulitis   Comorbidities that complicate the patient evaluation: Patient's presentation is complicated by their history of eosinophilic esophagitis  Additional history obtained: Additional history obtained from mother Records reviewed gastroenterology note  Lab Tests: I Ordered, and personally interpreted labs.  The pertinent results include: Ketonuria which can indicate dehydration  Medicines ordered and prescription drug management: I ordered medication including fentanyl for pain Reevaluation of the patient after these medicines showed that the patient    improved  Test Considered: Given patient's improvement and no tenderness, will defer CT imaging for now   After the interventions noted above, I reevaluated the patient and found that they have :improved  Complexity of problems  addressed: Patient's presentation is most consistent with  acute presentation with potential threat to life or bodily function  Disposition: After consideration of the diagnostic results and the patient's response to treatment,  I feel that the patent would benefit from discharge  .        Final diagnoses:  Generalized abdominal pain  Dehydration    ED Discharge Orders     None          Midge Golas, MD 12/23/23 [phone]

## 2023-12-23 NOTE — Discharge Instructions (Signed)

## 2024-01-04 ENCOUNTER — Other Ambulatory Visit (HOSPITAL_BASED_OUTPATIENT_CLINIC_OR_DEPARTMENT_OTHER): Payer: Self-pay

## 2024-01-31 ENCOUNTER — Telehealth: Payer: Self-pay

## 2024-01-31 NOTE — Telephone Encounter (Signed)
 Left message for patient to return call to schedule 1 year follow up with Dr San or POD B apps.  Will continue efforts.

## 2024-02-02 NOTE — Telephone Encounter (Signed)
 Left message for patient to return call to schedule 1 year follow up with Dr San or POD [facility] apps. Will continue efforts.

## 2024-02-03 DIAGNOSIS — Z419 Encounter for procedure for purposes other than remedying health state, unspecified: Secondary | ICD-10-CM | POA: Diagnosis not present

## 2024-02-08 NOTE — Telephone Encounter (Signed)
 Left message for patient to return call to schedule 1 year follow up with Dr San or POD [facility] apps.  After multiple attempts to reach patient by phone with no success, a letter will be mailed to the patient as reminder that a 1 year follow up is needed.

## 2024-02-20 ENCOUNTER — Other Ambulatory Visit (HOSPITAL_BASED_OUTPATIENT_CLINIC_OR_DEPARTMENT_OTHER): Payer: Self-pay

## 2024-02-20 ENCOUNTER — Ambulatory Visit (INDEPENDENT_AMBULATORY_CARE_PROVIDER_SITE_OTHER): Admitting: Family Medicine

## 2024-02-20 VITALS — BP 120/88 | HR 103 | Temp 98.1°F | Ht 69.0 in | Wt 200.2 lb

## 2024-02-20 DIAGNOSIS — F4323 Adjustment disorder with mixed anxiety and depressed mood: Secondary | ICD-10-CM

## 2024-02-20 DIAGNOSIS — Z13220 Encounter for screening for lipoid disorders: Secondary | ICD-10-CM | POA: Diagnosis not present

## 2024-02-20 DIAGNOSIS — Z131 Encounter for screening for diabetes mellitus: Secondary | ICD-10-CM | POA: Diagnosis not present

## 2024-02-20 DIAGNOSIS — F900 Attention-deficit hyperactivity disorder, predominantly inattentive type: Secondary | ICD-10-CM | POA: Diagnosis not present

## 2024-02-20 LAB — LIPID PANEL
Cholesterol: 211 mg/dL — ABNORMAL HIGH (ref 28–200)
HDL: 48.3 mg/dL
LDL Cholesterol: 136 mg/dL — ABNORMAL HIGH (ref 10–99)
NonHDL: 162.2
Total CHOL/HDL Ratio: 4
Triglycerides: 130 mg/dL (ref 10.0–149.0)
VLDL: 26 mg/dL (ref 0.0–40.0)

## 2024-02-20 MED ORDER — SERTRALINE HCL 100 MG PO TABS
100.0000 mg | ORAL_TABLET | Freq: Every day | ORAL | 3 refills | Status: AC
  Filled 2024-02-20: qty 90, 90d supply, fill #0

## 2024-02-20 MED ORDER — AMPHETAMINE-DEXTROAMPHET ER 20 MG PO CP24
20.0000 mg | ORAL_CAPSULE | ORAL | 0 refills | Status: AC
  Filled 2024-02-20: qty 30, 30d supply, fill #0

## 2024-02-20 NOTE — Progress Notes (Signed)
" ° °  Mitchell Bowman is a 28 y.o. male who presents today for an office visit.  Assessment/Plan:   Chronic Problems Addressed Today: ADHD (attention deficit hyperactivity disorder) Symptoms are not controlled and impacting his daily life.  We tried Vyvanse  last year however did not find that this was effective.  He has been on Concerta  in the past as well however had to discontinue due to migraines.  We did discuss treatment options.  Will try Adderall XR 20 mg daily.  He is aware of potential side effects.  He will follow-up with us  in a few weeks via MyChart we can adjust as needed.  He will come back for in person office visit in 3 months.  Situational mixed anxiety and depressive disorder Patient with increased anxiety recently close depressive symptoms have been well-controlled.  He is on Zoloft  50 mg daily though would like to increase the dose.  We will go to 100 mg daily.  He is aware of potential side effects.  He will follow-up with us  in a few weeks via MyChart and we can adjust as needed.  Preventative health care Check labs.  Vaccines declined.    Subjective:  HPI:  See assessment / plan for status of chronic conditions.   Discussed the use of AI scribe software for clinical note transcription with the patient, who gave verbal consent to proceed.  History of Present Illness Mitchell Bowman is a 28 year old male with ADHD and anxiety who presents for medication management and cholesterol evaluation.  He is experiencing a resurgence of anxiety-related behaviors, such as double-checking actions, which he had previously managed. He is currently taking sertraline  (Zoloft ) at a dose of 50 mg daily and feels that an increase in dosage might be necessary as these symptoms have been occurring frequently. No depressive symptoms are present, and he reports no side effects from the Zoloft .  He has a history of ADHD and previously tried Vyvanse , but did not notice a significant difference at  the time. He is unsure if this was due to the dosage or the medication itself. He has not tried Adderall before and is open to exploring different medication options for ADHD management. He mentions that he has not taken ADHD medication for many years, and his mother might remember more about his past treatments.  He wants to have his cholesterol levels checked due to a family history of cholesterol problems on his father's side. He is willing to have blood work done during this visit.  He is interested in enrolling in an online course for working on computers and believes that restarting ADHD medication would help him perform better in school.         Objective:  Physical Exam: BP 120/88   Pulse (!) 103   Temp 98.1 F (36.7 C) (Temporal)   Ht 5' 9 (1.753 m)   Wt 200 lb 3.2 oz (90.8 kg)   SpO2 97%   BMI 29.56 kg/m   Gen: No acute distress, resting comfortably CV: Regular rate and rhythm with no murmurs appreciated Pulm: Normal work of breathing, clear to auscultation bilaterally with no crackles, wheezes, or rhonchi Neuro: Grossly normal, moves all extremities Psych: Normal affect and thought content      Elver Stadler M. Kennyth, MD 02/20/2024 1:12 PM  "

## 2024-02-20 NOTE — Assessment & Plan Note (Signed)
 Symptoms are not controlled and impacting his daily life.  We tried Vyvanse  last year however did not find that this was effective.  He has been on Concerta  in the past as well however had to discontinue due to migraines.  We did discuss treatment options.  Will try Adderall XR 20 mg daily.  He is aware of potential side effects.  He will follow-up with us  in a few weeks via MyChart we can adjust as needed.  He will come back for in person office visit in 3 months.

## 2024-02-20 NOTE — Assessment & Plan Note (Signed)
 Patient with increased anxiety recently close depressive symptoms have been well-controlled.  He is on Zoloft  50 mg daily though would like to increase the dose.  We will go to 100 mg daily.  He is aware of potential side effects.  He will follow-up with us  in a few weeks via MyChart and we can adjust as needed.

## 2024-02-20 NOTE — Patient Instructions (Addendum)
 It was very nice to see you today!  VISIT SUMMARY: During your visit, we discussed your ADHD and anxiety symptoms, and we made adjustments to your medications. We also addressed your concerns about cholesterol levels and ordered blood work.  YOUR PLAN: ATTENTION-DEFICIT HYPERACTIVITY DISORDER (ADHD): Your ADHD symptoms are still affecting your daily life. You previously tried Vyvanse  without much success. -Start taking Adderall extended release at a low dose. -Monitor for side effects like migraines or headaches. -Report on how the medication is working and any side effects in a few weeks.  ANXIETY: Your anxiety symptoms have increased recently, although you are not experiencing any depressive symptoms. -Increase your sertraline  (Zoloft ) dose from 50 mg to 100 mg daily. -Monitor for side effects such as an upset stomach. -Report on how the medication is working and any side effects in a few weeks.  CHOLESTEROL: You have a family history of cholesterol issues and want to check your levels. -Blood work has been ordered to check your cholesterol and glucose levels.  Return in about 3 months (around 05/20/2024).   Take care, Dr Kennyth  PLEASE NOTE:  If you had any lab tests, please let us  know if you have not heard back within a few days. You may see your results on mychart before we have a chance to review them but we will give you a call once they are reviewed by us .   If we ordered any referrals today, please let us  know if you have not heard from their office within the next week.   If you had any urgent prescriptions sent in today, please check with the pharmacy within an hour of our visit to make sure the prescription was transmitted appropriately.   Please try these tips to maintain a healthy lifestyle:  Eat at least 3 REAL meals and 1-2 snacks per day.  Aim for no more than 5 hours between eating.  If you eat breakfast, please do so within one hour of getting up.   Each meal  should contain half fruits/vegetables, one quarter protein, and one quarter carbs (no bigger than a computer mouse)  Cut down on sweet beverages. This includes juice, soda, and sweet tea.   Drink at least 1 glass of water with each meal and aim for at least 8 glasses per day  Exercise at least 150 minutes every week.

## 2024-02-21 ENCOUNTER — Other Ambulatory Visit (HOSPITAL_BASED_OUTPATIENT_CLINIC_OR_DEPARTMENT_OTHER): Payer: Self-pay

## 2024-02-21 ENCOUNTER — Ambulatory Visit: Payer: Self-pay | Admitting: Family Medicine

## 2024-02-21 LAB — HEMOGLOBIN A1C: Hgb A1c MFr Bld: 5.1 % (ref 4.6–6.5)

## 2024-02-21 NOTE — Progress Notes (Signed)
 Cholesterol is mildly elevated but not to the point where we have to start medications. Blood sugar level is at goal. HE should continue to work on diet and exercise and we can recheck everything in a few years.

## 2024-02-23 ENCOUNTER — Encounter (HOSPITAL_BASED_OUTPATIENT_CLINIC_OR_DEPARTMENT_OTHER): Payer: Self-pay

## 2024-02-23 ENCOUNTER — Other Ambulatory Visit (HOSPITAL_BASED_OUTPATIENT_CLINIC_OR_DEPARTMENT_OTHER): Payer: Self-pay

## 2024-02-24 ENCOUNTER — Encounter: Payer: Self-pay | Admitting: Family Medicine

## 2024-02-24 ENCOUNTER — Other Ambulatory Visit (HOSPITAL_BASED_OUTPATIENT_CLINIC_OR_DEPARTMENT_OTHER): Payer: Self-pay

## 2024-02-24 NOTE — Telephone Encounter (Signed)
 Can we contact the pharmacy to see if they have either the 10 mg, 5 mg or, 15 mg in stock?  We can also send him to the [facility] which usually has better stock.

## 2024-02-25 ENCOUNTER — Other Ambulatory Visit (HOSPITAL_BASED_OUTPATIENT_CLINIC_OR_DEPARTMENT_OTHER): Payer: Self-pay

## 2024-03-02 ENCOUNTER — Other Ambulatory Visit (HOSPITAL_BASED_OUTPATIENT_CLINIC_OR_DEPARTMENT_OTHER): Payer: Self-pay

## 2024-03-19 ENCOUNTER — Ambulatory Visit: Admitting: Gastroenterology

## 2024-05-18 ENCOUNTER — Ambulatory Visit: Admitting: Family Medicine

## 2024-05-21 ENCOUNTER — Ambulatory Visit: Admitting: Family Medicine
